# Patient Record
Sex: Male | Born: 1970 | Race: White | Hispanic: No | Marital: Married | Smoking: Never smoker
Health system: Southern US, Community
[De-identification: ages and names within clinical notes are randomized; demographics above are authoritative.]

## PROBLEM LIST (undated history)

## (undated) DIAGNOSIS — F32A Depression, unspecified: Secondary | ICD-10-CM

## (undated) DIAGNOSIS — M1990 Unspecified osteoarthritis, unspecified site: Secondary | ICD-10-CM

## (undated) DIAGNOSIS — J329 Chronic sinusitis, unspecified: Secondary | ICD-10-CM

## (undated) DIAGNOSIS — F419 Anxiety disorder, unspecified: Secondary | ICD-10-CM

## (undated) DIAGNOSIS — T7840XA Allergy, unspecified, initial encounter: Secondary | ICD-10-CM

## (undated) DIAGNOSIS — K219 Gastro-esophageal reflux disease without esophagitis: Secondary | ICD-10-CM

## (undated) HISTORY — DX: Depression, unspecified: F32.A

## (undated) HISTORY — DX: Unspecified osteoarthritis, unspecified site: M19.90

## (undated) HISTORY — PX: FRONTAL SINUSOTOMY: SUR1296

## (undated) HISTORY — DX: Anxiety disorder, unspecified: F41.9

## (undated) HISTORY — DX: Allergy, unspecified, initial encounter: T78.40XA

## (undated) HISTORY — DX: Gastro-esophageal reflux disease without esophagitis: K21.9

## (undated) HISTORY — PX: OTHER SURGICAL HISTORY: SHX169

## (undated) HISTORY — PX: CHOLECYSTECTOMY: SHX55

---

## 2008-08-04 DIAGNOSIS — Z8719 Personal history of other diseases of the digestive system: Secondary | ICD-10-CM | POA: Insufficient documentation

## 2010-12-14 ENCOUNTER — Emergency Department (INDEPENDENT_AMBULATORY_CARE_PROVIDER_SITE_OTHER): Payer: BC Managed Care – PPO

## 2010-12-14 ENCOUNTER — Emergency Department (HOSPITAL_BASED_OUTPATIENT_CLINIC_OR_DEPARTMENT_OTHER)
Admission: EM | Admit: 2010-12-14 | Discharge: 2010-12-14 | Disposition: A | Discharge status: Home / Self Care | Payer: BC Managed Care – PPO | Attending: Emergency Medicine | Admitting: Emergency Medicine

## 2010-12-14 DIAGNOSIS — X500XXA Overexertion from strenuous movement or load, initial encounter: Secondary | ICD-10-CM

## 2010-12-14 DIAGNOSIS — K219 Gastro-esophageal reflux disease without esophagitis: Secondary | ICD-10-CM | POA: Insufficient documentation

## 2010-12-14 DIAGNOSIS — S93609A Unspecified sprain of unspecified foot, initial encounter: Secondary | ICD-10-CM | POA: Insufficient documentation

## 2010-12-14 DIAGNOSIS — Y9375 Activity, martial arts: Secondary | ICD-10-CM | POA: Insufficient documentation

## 2010-12-14 DIAGNOSIS — M79609 Pain in unspecified limb: Secondary | ICD-10-CM

## 2011-03-16 ENCOUNTER — Encounter: Payer: Self-pay | Admitting: Emergency Medicine

## 2011-03-16 ENCOUNTER — Emergency Department (INDEPENDENT_AMBULATORY_CARE_PROVIDER_SITE_OTHER): Payer: BC Managed Care – PPO

## 2011-03-16 ENCOUNTER — Emergency Department (HOSPITAL_BASED_OUTPATIENT_CLINIC_OR_DEPARTMENT_OTHER)
Admission: EM | Admit: 2011-03-16 | Discharge: 2011-03-16 | Disposition: A | Discharge status: Home / Self Care | Payer: BC Managed Care – PPO | Attending: Emergency Medicine | Admitting: Emergency Medicine

## 2011-03-16 DIAGNOSIS — R059 Cough, unspecified: Secondary | ICD-10-CM

## 2011-03-16 DIAGNOSIS — R0989 Other specified symptoms and signs involving the circulatory and respiratory systems: Secondary | ICD-10-CM

## 2011-03-16 DIAGNOSIS — R0602 Shortness of breath: Secondary | ICD-10-CM

## 2011-03-16 DIAGNOSIS — J189 Pneumonia, unspecified organism: Secondary | ICD-10-CM

## 2011-03-16 DIAGNOSIS — R05 Cough: Secondary | ICD-10-CM

## 2011-03-16 LAB — CBC
HCT: 40.7 % (ref 39.0–52.0)
MCHC: 34.2 g/dL (ref 30.0–36.0)
MCV: 80 fL (ref 78.0–100.0)
RDW: 13.5 % (ref 11.5–15.5)

## 2011-03-16 LAB — DIFFERENTIAL
Basophils Absolute: 0 10*3/uL (ref 0.0–0.1)
Basophils Relative: 0 % (ref 0–1)
Eosinophils Relative: 2 % (ref 0–5)
Monocytes Absolute: 2.1 10*3/uL — ABNORMAL HIGH (ref 0.1–1.0)

## 2011-03-16 MED ORDER — LEVOFLOXACIN 500 MG PO TABS: 500.0000 mg | ORAL_TABLET | Freq: Every day | ORAL | Status: AC

## 2011-03-16 MED ORDER — ALBUTEROL SULFATE (5 MG/ML) 0.5% IN NEBU
2.5000 mg | INHALATION_SOLUTION | Freq: Once | RESPIRATORY_TRACT | Status: AC
  Administered 2011-03-16: 2.5 mg via RESPIRATORY_TRACT
  Filled 2011-03-16: qty 0.5

## 2011-03-16 MED ORDER — LEVOFLOXACIN 500 MG PO TABS
500.0000 mg | ORAL_TABLET | Freq: Every day | ORAL | Status: DC
  Filled 2011-03-16: qty 1

## 2011-03-16 MED ORDER — IPRATROPIUM BROMIDE 0.02 % IN SOLN
0.5000 mg | Freq: Once | RESPIRATORY_TRACT | Status: AC
  Administered 2011-03-16: 0.5 mg via RESPIRATORY_TRACT
  Filled 2011-03-16: qty 2.5

## 2011-03-16 MED ORDER — PREDNISONE 10 MG PO TABS: 50.0000 mg | ORAL_TABLET | Freq: Every day | ORAL | Status: AC

## 2011-03-16 MED ORDER — PREDNISONE 20 MG PO TABS
60.0000 mg | ORAL_TABLET | Freq: Once | ORAL | Status: AC
  Administered 2011-03-16: 60 mg via ORAL
  Filled 2011-03-16: qty 3

## 2011-03-16 NOTE — ED Notes (Signed)
Pt ambulatory. Voiced understanding of prescriptions.

## 2011-03-16 NOTE — ED Notes (Signed)
Pt reports increased SOB chest congestion and tightness with cough HX of CF

## 2011-03-16 NOTE — ED Provider Notes (Signed)
History     CSN: [id number] Arrival date & time: 03/16/2011  2:36 PM  Chief Complaint  Patient presents with  . Cough    Cough with increased SOB Hx of CF with Treatment at [facility]   Patient is a 40 y.o. male presenting with cough. The history is provided by the patient.  Cough The current episode started 2 days ago. The problem occurs constantly. The problem has been gradually worsening. 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 There has been no fever. Associated symptoms include chills, myalgias, shortness of breath and wheezing. Pertinent negatives include no chest pain, no sweats, no ear pain, no rhinorrhea and no sore throat. He has tried nothing for the symptoms. He is not a smoker.  Symptoms are moderate to severe. He has associated tightness in his chest. He called his PCP who told him to come in to be checked whether he has an infection or a flare up of his Cystic Fibrosis.  Past Medical History  Diagnosis Date  . Cystic fibrosis     Past Surgical History  Procedure Date  . Frontal sinusotomy   . Cholecystectomy     Family History  Problem Relation Age of Onset  . Cancer Mother   . Hyperlipidemia Father   . Hypertension Father     History  Substance Use Topics  . Smoking status: Never Smoker   . Smokeless tobacco: Not on file  . Alcohol Use: No      Review of Systems  Constitutional: Positive for chills.  HENT: Negative for ear pain, sore throat and rhinorrhea.   Respiratory: Positive for cough, shortness of breath and wheezing.   Cardiovascular: Negative for chest pain.  Musculoskeletal: Positive for myalgias.  All other systems reviewed and are negative.    Physical Exam  BP 129/79  Pulse 102  Temp(Src) 99.6 F (37.6 C) (Oral)  Resp 22  SpO2 99%  Physical Exam  Nursing note and vitals reviewed. Constitutional: He is oriented to person, place, and time. He appears  well-developed and well-nourished. No distress.  HENT:  Head: Normocephalic and atraumatic.  Right Ear: External ear normal.  Left Ear: External ear normal.  Nose: Nose normal.  Mouth/Throat: Oropharynx is clear and moist.  Eyes: Conjunctivae and EOM are normal. Pupils are equal, round, and reactive to light. No scleral icterus.  Neck: No JVD present.  Cardiovascular: Normal rate, regular rhythm and normal heart sounds.   No murmur heard. Pulmonary/Chest: Effort normal.       Few rales left base. Scattered wheezes.  Abdominal: Soft. Bowel sounds are normal. He exhibits no mass. There is no tenderness.  Musculoskeletal: Normal range of motion. He exhibits no edema and no tenderness.  Lymphadenopathy:    He has no cervical adenopathy.  Neurological: He is alert and oriented to person, place, and time. No cranial nerve deficit. Coordination normal.  Skin: Skin is warm and dry. No rash noted.  Psychiatric: He has a normal mood and affect.    ED Course  Procedures  MDM CAP in a patient with Cystic Fibrosis. He feels better after Albuterol/Atrovent NMT. Labs and x-rays reviewed, images viewed by me.  Results for orders placed during the hospital encounter of 03/16/11  CBC      Component Value Range   WBC 14.5 (*) 4.0 - 10.5 (K/uL)   RBC 5.09  4.22 - 5.81 (MIL/uL)   Hemoglobin 13.9  13.0 -  17.0 (g/dL)   HCT 13.2  44.0 - 10.2 (%)   MCV 80.0  78.0 - 100.0 (fL)   MCH 27.3  26.0 - 34.0 (pg)   MCHC 34.2  30.0 - 36.0 (g/dL)   RDW 72.5  36.6 - 44.0 (%)   Platelets 293  150 - 400 (K/uL)  DIFFERENTIAL      Component Value Range   Neutrophils Relative 74  43 - 77 (%)   Neutro Abs 10.7 (*) 1.7 - 7.7 (K/uL)   Lymphocytes Relative 10 (*) 12 - 46 (%)   Lymphs Abs 1.4  0.7 - 4.0 (K/uL)   Monocytes Relative 14 (*) 3 - 12 (%)   Monocytes Absolute 2.1 (*) 0.1 - 1.0 (K/uL)   Eosinophils Relative 2  0 - 5 (%)   Eosinophils Absolute 0.3  0.0 - 0.7 (K/uL)   Basophils Relative 0  0 - 1 (%)    Basophils Absolute 0.0  0.0 - 0.1 (K/uL)   Dg Chest 2 View  03/16/2011  *RADIOLOGY REPORT*  Clinical Data: Cough, shortness of breath, chest congestion  CHEST - 2 VIEW  Comparison: None.  Findings: Prominent interstitial markings with mild nodularity in the lateral right mid lung, possibly reflecting mild pneumonia. No pleural effusion or pneumothorax.  Cardiomediastinal silhouette is within normal limits.  Visualized osseous structures are within normal limits.  Cholecystectomy clips.  IMPRESSION: Prominent interstitial markings with mild nodularity in the lateral right mid lung, possibly reflecting mild pneumonia.  Original Report Authenticated By: Charline Bills, M.D.          Dione Booze, MD 03/16/11 1622

## 2011-07-02 ENCOUNTER — Encounter (HOSPITAL_COMMUNITY): Payer: Self-pay | Admitting: *Deleted

## 2011-07-02 ENCOUNTER — Emergency Department (HOSPITAL_COMMUNITY)
Admission: EM | Admit: 2011-07-02 | Discharge: 2011-07-02 | Disposition: A | Discharge status: Home / Self Care | Payer: BC Managed Care – PPO | Attending: Emergency Medicine | Admitting: Emergency Medicine

## 2011-07-02 DIAGNOSIS — R109 Unspecified abdominal pain: Secondary | ICD-10-CM | POA: Insufficient documentation

## 2011-07-02 DIAGNOSIS — K859 Acute pancreatitis without necrosis or infection, unspecified: Secondary | ICD-10-CM

## 2011-07-02 DIAGNOSIS — M549 Dorsalgia, unspecified: Secondary | ICD-10-CM | POA: Insufficient documentation

## 2011-07-02 HISTORY — DX: Chronic sinusitis, unspecified: J32.9

## 2011-07-02 HISTORY — DX: Cystic fibrosis, unspecified: E84.9

## 2011-07-02 LAB — COMPREHENSIVE METABOLIC PANEL
AST: 19 U/L (ref 0–37)
Albumin: 4.5 g/dL (ref 3.5–5.2)
Alkaline Phosphatase: 79 U/L (ref 39–117)
Chloride: 103 mEq/L (ref 96–112)
Creatinine, Ser: 0.95 mg/dL (ref 0.50–1.35)
Potassium: 4.1 mEq/L (ref 3.5–5.1)
Total Bilirubin: 0.5 mg/dL (ref 0.3–1.2)

## 2011-07-02 LAB — URINALYSIS, ROUTINE W REFLEX MICROSCOPIC
Bilirubin Urine: NEGATIVE
Glucose, UA: NEGATIVE mg/dL
Hgb urine dipstick: NEGATIVE
Ketones, ur: NEGATIVE mg/dL
Leukocytes, UA: NEGATIVE
Nitrite: NEGATIVE
Protein, ur: NEGATIVE mg/dL
Specific Gravity, Urine: 1.013 (ref 1.005–1.030)
Urobilinogen, UA: 0.2 mg/dL (ref 0.0–1.0)
pH: 8 (ref 5.0–8.0)

## 2011-07-02 LAB — CBC
HCT: 42.4 % (ref 39.0–52.0)
Hemoglobin: 14.2 g/dL (ref 13.0–17.0)
MCH: 27.7 pg (ref 26.0–34.0)
MCHC: 33.5 g/dL (ref 30.0–36.0)
MCV: 82.7 fL (ref 78.0–100.0)
Platelets: 324 K/uL (ref 150–400)
RBC: 5.13 MIL/uL (ref 4.22–5.81)
RDW: 13.9 % (ref 11.5–15.5)
WBC: 9.1 K/uL (ref 4.0–10.5)

## 2011-07-02 LAB — COMPREHENSIVE METABOLIC PANEL WITH GFR
ALT: 15 U/L (ref 0–53)
BUN: 9 mg/dL (ref 6–23)
CO2: 27 meq/L (ref 19–32)
Calcium: 9.8 mg/dL (ref 8.4–10.5)
GFR calc Af Amer: >90 mL/min (ref >90)
GFR calc non Af Amer: >90 mL/min (ref >90)
Glucose, Bld: 88 mg/dL (ref 70–99)
Sodium: 141 meq/L (ref 135–145)
Total Protein: 7.7 g/dL (ref 6.0–8.3)

## 2011-07-02 LAB — DIFFERENTIAL
Basophils Absolute: 0 10*3/uL (ref 0.0–0.1)
Basophils Relative: 0 % (ref 0–1)
Eosinophils Absolute: 0.2 K/uL (ref 0.0–0.7)
Eosinophils Relative: 2 % (ref 0–5)
Lymphocytes Relative: 14 % (ref 12–46)
Lymphs Abs: 1.3 K/uL (ref 0.7–4.0)
Monocytes Absolute: 0.8 K/uL (ref 0.1–1.0)
Monocytes Relative: 9 % (ref 3–12)
Neutro Abs: 6.8 10*3/uL (ref 1.7–7.7)
Neutrophils Relative %: 75 % (ref 43–77)

## 2011-07-02 LAB — LIPASE, BLOOD: Lipase: 71 U/L — ABNORMAL HIGH (ref 11–59)

## 2011-07-02 MED ORDER — SODIUM CHLORIDE 0.9 % IV SOLN
Freq: Once | INTRAVENOUS | Status: AC
  Administered 2011-07-02: 14:00:00 via INTRAVENOUS

## 2011-07-02 MED ORDER — HYDROMORPHONE HCL PF 1 MG/ML IJ SOLN
1.0000 mg | Freq: Once | INTRAMUSCULAR | Status: AC
  Administered 2011-07-02: 1 mg via INTRAVENOUS
  Filled 2011-07-02: qty 1

## 2011-07-02 MED ORDER — OXYCODONE-ACETAMINOPHEN 5-325 MG PO TABS: ORAL_TABLET | ORAL | Status: DC

## 2011-07-02 NOTE — Discharge Instructions (Signed)
Acute Pancreatitis The pancreas is a large gland located behind your stomach. It produces (secretes) enzymes. These enzymes help digest food. It also releases the hormones glucagon and insulin. These hormones help regulate blood sugar. When the pancreas becomes inflamed, the disease is called pancreatitis. Inflammation of the pancreas occurs when enzymes from the pancreas begin attacking and digesting the pancreas. CAUSES  Most cases ofsudden onset (acute) pancreatitis are caused by:  Alcohol abuse.   Gallstones.  Other less common causes are:  Some medications.   Exposure to certain chemicals   Infection.   Damage caused by an accident (trauma).   Surgery of the belly (abdomen).  SYMPTOMS  Acute pancreatitis usually begins with pain in the upper abdomen and may radiate to the back. This pain may last a couple days. The constant pain varies from mild to severe. The acute form of this disease may vary from mild, nonspecific abdominal pain to profound shock with coma. About 1 in 5 cases are severe. These patients become dehydrated and develop low blood pressure. In severe cases, bleeding into the pancreas can lead to shock and death. The lungs, heart, and kidneys may fail. DIAGNOSIS  Your caregiver will form a clinical opinion after giving you an exam. Laboratory work is used to confirm this diagnosis. Often,a digestive enzyme from the pancreas (serum amylase) and other enzymes are elevated. Sugars and fats (lipids) in the blood may be elevated. There may also be changes in the following levels: calcium, magnesium, potassium, chloride and bicarbonate (chemicals in the blood). X-rays, a CT scan, or ultrasound of your abdomen may be necessary to search for other causes of your abdominal pain. TREATMENT  Most pancreatitis requires treatment of symptoms. Most acute attacks last a couple of days. Your caregiver can discuss the treatment options with you.  If complications occur, hospitalization  may be necessary for pain control and intravenous (IV) fluid replacement.   Sometimes, a tube may be put into the stomach to control vomiting.   Food may not be allowed for 3 to 4 days. This gives the pancreas time to rest. Giving the pancreas a rest means there is no stimulation that would produce more enzymes and cause more damage.   Medicines (antibiotics) that kill germs may be given if infection is the cause.   Sometimes, surgery may be required.   Following an acute attack, your caregiver will determine the cause, if possible, and offer suggestions to prevent recurrences.  HOME CARE INSTRUCTIONS   Eat smaller, more frequent meals. This reduces the amount of digestive juices the pancreas produces.   Decrease the amount of fat in your diet. This may help reduce loose, diarrheal stools.   Drink enough water and fluids to keep your urine clear or pale yellow. This is to avoid dehydration which can cause increased pain.   Talk to your caregiver about pain relievers or other medicines that may help.   Avoid anything that may have triggered your pancreatitis (for example, alcohol).   Follow the diet advised by your caregiver. Do not advance the diet too soon.   Take medicines as prescribed.   Get plenty of rest.   Check your blood sugar at home as directed by your caregiver.   If your caregiver has given you a follow-up appointment, it is very important to keep that appointment. Not keeping the appointment could result in a lasting (chronic) or permanent injury, pain, and disability. If there is any problem keeping the appointment, you must call to reschedule.    SEEK MEDICAL CARE IF:   You are not recovering in the time described by your caregiver.   You have persistent pain, weakness, or feel sick to your stomach (nauseous).   You have recovered and then have another bout of pain.  SEEK IMMEDIATE MEDICAL CARE IF:   You are unable to eat or keep fluids down.   Your pain  increases a lot or changes.   You have an oral temperature above 102 F (38.9 C), not controlled by medicine.   Your skin or the white part of your eyes look yellow (jaundice).   You develop vomiting.   You feel dizzy or faint.   Your blood sugar is high (over 300).  MAKE SURE YOU:   Understand these instructions.   Will watch your condition.   Will get help right away if you are not doing well or get worse.  Document Released: 07/21/2005 Document Revised: 04/02/2011 Document Reviewed: 03/03/2008 Exit[employer] Patient Information 2012 [employer]. 

## 2011-07-02 NOTE — ED Notes (Signed)
Pt ambulatory to the BR with steady gait

## 2011-07-02 NOTE — ED Notes (Addendum)
Patient aware of need for urine specimen. Patient states that he doesn't need to go at the time supplies labeled at bedside.

## 2011-07-02 NOTE — ED Notes (Signed)
Pt reports LUQ that radiates to his mid abdomen.  Pt denies n/v at this time. Pt reports pain woke him up at 0200 today.  Pt reports hx of pancreatitis.

## 2011-07-02 NOTE — ED Provider Notes (Signed)
History     CSN: [id number] Arrival date & time: 07/02/2011  9:06 AM   First MD Initiated Contact with Patient 07/02/11 [phone]      Chief Complaint  Patient presents with  . 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 He reports he had his first episode when he is about 40 years old and has had rare episodes however in the last 5 years they have become more frequent. He does not report any obvious inciting factors. He reports he did eat a heavy meal last night but he does not drink alcohol, has not had any fevers or URI symptoms. His primary care physician is with [facility], and his CSF physician is at [facility]. He reports that previously when he has episodes he often will have to be admitted for her for 5 days for IV fluids and pain medications. He reports that this episode is mild compared to others. He reports he only has pain in his mid abdominal area that radiates to his back, no nausea vomiting or diarrhea and no fevers. He reports no shortness of breath or coughing.  The history is provided by the patient.    Past Medical History  Diagnosis Date  . Cystic fibrosis   . Pancreatic cystic fibrosis   . Sinusitis     Past Surgical History  Procedure Date  . Frontal sinusotomy   . Cholecystectomy     Family History  Problem Relation Age of Onset  . Cancer Mother   . Hyperlipidemia Father   . Hypertension Father     History  Substance Use Topics  . Smoking status: Never Smoker   . Smokeless tobacco: Not on file  . Alcohol Use: No      Review of Systems  Constitutional: Negative for fever and chills.  HENT: Negative for congestion and rhinorrhea.   Respiratory: Negative for cough and shortness of breath.   Gastrointestinal: Positive for abdominal pain. Negative for nausea,  vomiting, diarrhea and constipation.  Musculoskeletal: Positive for back pain.  All other systems reviewed and are negative.    Allergies  Septra  Home Medications   Current Outpatient Rx  Name Route Sig Dispense Refill  . ERGOCALCIFEROL 50000 UNITS PO CAPS Oral Take 50,000 Units by mouth 2 (two) times a week. Monday,thursday    . FLUTICASONE PROPIONATE 50 MCG/ACT NA SUSP Nasal Place 2 sprays into the nose daily.     Marland Kitchen FLUTICASONE-SALMETEROL 500-50 MCG/DOSE IN AEPB Inhalation Inhale 1 puff into the lungs every 12 (twelve) hours.      Marland Kitchen HYDROCODONE-ACETAMINOPHEN 5-500 MG PO CAPS Oral Take 1 capsule by mouth 2 (two) times daily as needed. For pain      . IBUPROFEN 200 MG PO TABS Oral Take 400 mg by mouth once as needed. For pain     . OMEPRAZOLE 10 MG PO CPDR Oral Take 10 mg by mouth daily.     . ALBUTEROL SULFATE HFA 108 (90 BASE) MCG/ACT IN AERS Inhalation Inhale 2 puffs into the lungs 2 (two) times daily as needed. For shortness of breath     . CETIRIZINE HCL 10 MG PO TABS Oral Take 10 mg by mouth daily.        BP 138/84  Pulse 78  Temp(Src) 98.4 F (36.9 C) (Oral)  Resp 16  Wt 154 lb (69.854 kg)  SpO2 100%  Physical Exam  Nursing note and vitals reviewed. Constitutional: He is oriented to person, place, and time. He appears well-developed and well-nourished.  HENT:  Head: Normocephalic and atraumatic.  Eyes: Conjunctivae are normal. Pupils are equal, round, and reactive to light. No scleral icterus.  Neck: Normal range of motion. Neck supple.  Cardiovascular: Normal rate.   Pulmonary/Chest: Effort normal and breath sounds normal. No respiratory distress.  Abdominal: Soft. There is tenderness. There is no rebound and no guarding.  Musculoskeletal: Normal range of motion.  Neurological: He is alert and oriented to person, place, and time.  Skin: Skin is warm and dry.    ED Course  Procedures (including critical care time)   Labs Reviewed  CBC  DIFFERENTIAL    COMPREHENSIVE METABOLIC PANEL  LIPASE, BLOOD  URINALYSIS, ROUTINE W REFLEX MICROSCOPIC   No results found.   No diagnosis found.    MDM  IVF's, IV analgesics, will get lipase to compare to prior episodes.  Pt is not vomiting, doesn't appear dehydrated.  Pt does not want to be admitted at [facility] even though PCP is with [facility].   No rebound tenderness, no surgical abd on exam.       2:06 PM Due to lipase only at 71, Ranson's criteria suggests mortality is not high, if pt is feeling improved, can eat and drink, likely can go home.    3:41 PM Pt feels improved after IV dilaudid 1 mg.  If he can eat and drink, I think pt can go home.  He normally takes a vicodin for pain when it flares, reports it didn't help this AM.  Will give a percocet prescription   Gavin Pound. Lasaro Primm, MD 07/02/11 1542

## 2011-07-02 NOTE — ED Notes (Signed)
Pt states "I woke up around 0200 with abd pain, I have chronic pancreatitis d/t mild cystic fibrosis, I called my MD @ [facility] & he told me to come in, I need to look for a pulmonary specialist here"

## 2012-01-28 ENCOUNTER — Other Ambulatory Visit: Payer: Self-pay | Admitting: Gastroenterology

## 2012-01-28 DIAGNOSIS — K861 Other chronic pancreatitis: Secondary | ICD-10-CM

## 2012-01-28 DIAGNOSIS — R1013 Epigastric pain: Secondary | ICD-10-CM

## 2012-02-03 ENCOUNTER — Other Ambulatory Visit (HOSPITAL_COMMUNITY): Payer: Self-pay | Admitting: Gastroenterology

## 2012-02-03 ENCOUNTER — Other Ambulatory Visit: Payer: Self-pay | Admitting: Gastroenterology

## 2012-02-03 ENCOUNTER — Ambulatory Visit (HOSPITAL_COMMUNITY)
Admission: RE | Admit: 2012-02-03 | Discharge: 2012-02-03 | Disposition: A | Discharge status: Home / Self Care | Payer: 59 | Source: Ambulatory Visit | Attending: Gastroenterology | Admitting: Gastroenterology

## 2012-02-03 DIAGNOSIS — K861 Other chronic pancreatitis: Secondary | ICD-10-CM

## 2012-02-03 DIAGNOSIS — R1013 Epigastric pain: Secondary | ICD-10-CM | POA: Insufficient documentation

## 2012-02-03 DIAGNOSIS — R911 Solitary pulmonary nodule: Secondary | ICD-10-CM | POA: Insufficient documentation

## 2012-02-03 DIAGNOSIS — K7689 Other specified diseases of liver: Secondary | ICD-10-CM | POA: Insufficient documentation

## 2012-02-03 MED ORDER — GADOBENATE DIMEGLUMINE 529 MG/ML IV SOLN
14.0000 mL | Freq: Once | INTRAVENOUS | Status: AC
  Administered 2012-02-03: 14 mL via INTRAVENOUS

## 2012-06-12 ENCOUNTER — Encounter (HOSPITAL_COMMUNITY): Payer: Self-pay | Admitting: Emergency Medicine

## 2012-06-12 ENCOUNTER — Inpatient Hospital Stay (HOSPITAL_COMMUNITY)
Admission: EM | Admit: 2012-06-12 | Discharge: 2012-06-16 | DRG: 439 | Disposition: A | Discharge status: Home / Self Care | Payer: 59 | Attending: Internal Medicine | Admitting: Internal Medicine

## 2012-06-12 DIAGNOSIS — K859 Acute pancreatitis without necrosis or infection, unspecified: Principal | ICD-10-CM

## 2012-06-12 DIAGNOSIS — J329 Chronic sinusitis, unspecified: Secondary | ICD-10-CM

## 2012-06-12 DIAGNOSIS — J309 Allergic rhinitis, unspecified: Secondary | ICD-10-CM

## 2012-06-12 DIAGNOSIS — K861 Other chronic pancreatitis: Secondary | ICD-10-CM | POA: Diagnosis present

## 2012-06-12 LAB — CBC WITH DIFFERENTIAL/PLATELET
Eosinophils Absolute: 0.3 10*3/uL (ref 0.0–0.7)
Eosinophils Relative: 2 % (ref 0–5)
Hemoglobin: 13.9 g/dL (ref 13.0–17.0)
Lymphocytes Relative: 7 % — ABNORMAL LOW (ref 12–46)
Lymphs Abs: 1.2 10*3/uL (ref 0.7–4.0)
MCH: 27.5 pg (ref 26.0–34.0)
MCV: 79.8 fL (ref 78.0–100.0)
Monocytes Relative: 7 % (ref 3–12)
RBC: 5.05 MIL/uL (ref 4.22–5.81)
WBC: 16.1 10*3/uL — ABNORMAL HIGH (ref 4.0–10.5)

## 2012-06-12 LAB — COMPREHENSIVE METABOLIC PANEL
ALT: 20 U/L (ref 0–53)
Alkaline Phosphatase: 68 U/L (ref 39–117)
BUN: 10 mg/dL (ref 6–23)
CO2: 27 mEq/L (ref 19–32)
Calcium: 9.3 mg/dL (ref 8.4–10.5)
GFR calc Af Amer: >90 mL/min (ref >90)
GFR calc non Af Amer: >90 mL/min (ref >90)
Glucose, Bld: 94 mg/dL (ref 70–99)
Potassium: 4.1 mEq/L (ref 3.5–5.1)
Total Protein: 7.4 g/dL (ref 6.0–8.3)

## 2012-06-12 LAB — MRSA PCR SCREENING: MRSA by PCR: NEGATIVE

## 2012-06-12 LAB — LIPASE, BLOOD: Lipase: 1834 U/L — ABNORMAL HIGH (ref 11–59)

## 2012-06-12 MED ORDER — HYDROMORPHONE HCL PF 1 MG/ML IJ SOLN: 0.5000 mg | INTRAMUSCULAR | Status: DC | PRN

## 2012-06-12 MED ORDER — HYDROMORPHONE HCL PF 1 MG/ML IJ SOLN
1.0000 mg | INTRAMUSCULAR | Status: AC | PRN
  Administered 2012-06-13 (×2): 1 mg via INTRAVENOUS
  Filled 2012-06-12 (×2): qty 1

## 2012-06-12 MED ORDER — FLUTICASONE PROPIONATE 50 MCG/ACT NA SUSP
2.0000 | Freq: Every day | NASAL | Status: DC
  Administered 2012-06-12 – 2012-06-15 (×4): 2 via NASAL
  Filled 2012-06-12: qty 16

## 2012-06-12 MED ORDER — HYDROMORPHONE HCL PF 1 MG/ML IJ SOLN
1.0000 mg | Freq: Once | INTRAMUSCULAR | Status: AC
  Administered 2012-06-12: 1 mg via INTRAVENOUS
  Filled 2012-06-12: qty 1

## 2012-06-12 MED ORDER — ENOXAPARIN SODIUM 40 MG/0.4ML ~~LOC~~ SOLN
40.0000 mg | SUBCUTANEOUS | Status: DC
  Filled 2012-06-12 (×5): qty 0.4

## 2012-06-12 MED ORDER — MOMETASONE FURO-FORMOTEROL FUM 200-5 MCG/ACT IN AERO
2.0000 | INHALATION_SPRAY | Freq: Two times a day (BID) | RESPIRATORY_TRACT | Status: DC
  Administered 2012-06-13 – 2012-06-16 (×7): 2 via RESPIRATORY_TRACT
  Filled 2012-06-12: qty 8.8

## 2012-06-12 MED ORDER — FLUCONAZOLE 100 MG PO TABS
100.0000 mg | ORAL_TABLET | Freq: Every day | ORAL | Status: DC
  Administered 2012-06-12 – 2012-06-15 (×4): 100 mg via ORAL
  Filled 2012-06-12 (×5): qty 1

## 2012-06-12 MED ORDER — ONDANSETRON HCL 4 MG/2ML IJ SOLN
4.0000 mg | Freq: Once | INTRAMUSCULAR | Status: AC
  Administered 2012-06-12: 4 mg via INTRAVENOUS
  Filled 2012-06-12: qty 2

## 2012-06-12 MED ORDER — ONDANSETRON HCL 4 MG/2ML IJ SOLN: 4.0000 mg | Freq: Four times a day (QID) | INTRAMUSCULAR | Status: DC | PRN

## 2012-06-12 MED ORDER — ALUM & MAG HYDROXIDE-SIMETH 200-200-20 MG/5ML PO SUSP: 30.0000 mL | Freq: Four times a day (QID) | ORAL | Status: DC | PRN

## 2012-06-12 MED ORDER — ACETAMINOPHEN 325 MG PO TABS: 650.0000 mg | ORAL_TABLET | Freq: Four times a day (QID) | ORAL | Status: DC | PRN

## 2012-06-12 MED ORDER — SODIUM CHLORIDE 0.9 % IV SOLN
INTRAVENOUS | Status: AC
  Administered 2012-06-12: 21:00:00 via INTRAVENOUS

## 2012-06-12 MED ORDER — HYDROMORPHONE HCL PF 2 MG/ML IJ SOLN
2.0000 mg | Freq: Once | INTRAMUSCULAR | Status: AC
  Administered 2012-06-12: 2 mg via INTRAVENOUS
  Filled 2012-06-12: qty 1

## 2012-06-12 MED ORDER — ONDANSETRON HCL 4 MG PO TABS: 4.0000 mg | ORAL_TABLET | Freq: Four times a day (QID) | ORAL | Status: DC | PRN

## 2012-06-12 MED ORDER — SODIUM CHLORIDE 0.9 % IV SOLN
INTRAVENOUS | Status: DC
  Administered 2012-06-12 – 2012-06-14 (×4): via INTRAVENOUS
  Administered 2012-06-15: 75 mL via INTRAVENOUS

## 2012-06-12 MED ORDER — PANTOPRAZOLE SODIUM 40 MG IV SOLR
40.0000 mg | Freq: Two times a day (BID) | INTRAVENOUS | Status: DC
  Administered 2012-06-12 – 2012-06-14 (×5): 40 mg via INTRAVENOUS
  Filled 2012-06-12 (×7): qty 40

## 2012-06-12 MED ORDER — ACETAMINOPHEN 650 MG RE SUPP: 650.0000 mg | Freq: Four times a day (QID) | RECTAL | Status: DC | PRN

## 2012-06-12 MED ORDER — DICLOXACILLIN SODIUM 500 MG PO CAPS
500.0000 mg | ORAL_CAPSULE | Freq: Four times a day (QID) | ORAL | Status: DC
  Administered 2012-06-13 – 2012-06-15 (×12): 500 mg via ORAL
  Filled 2012-06-12 (×2): qty 1

## 2012-06-12 MED ORDER — SODIUM CHLORIDE 0.9 % IV BOLUS (SEPSIS)
1000.0000 mL | Freq: Once | INTRAVENOUS | Status: AC
  Administered 2012-06-12: 1000 mL via INTRAVENOUS

## 2012-06-12 MED ORDER — LORATADINE 10 MG PO TABS
10.0000 mg | ORAL_TABLET | Freq: Every day | ORAL | Status: DC
  Administered 2012-06-13 – 2012-06-15 (×3): 10 mg via ORAL
  Filled 2012-06-12 (×5): qty 1

## 2012-06-12 MED ORDER — ONDANSETRON HCL 4 MG/2ML IJ SOLN: 4.0000 mg | Freq: Three times a day (TID) | INTRAMUSCULAR | Status: AC | PRN

## 2012-06-12 MED ORDER — OXYCODONE HCL 5 MG PO TABS
5.0000 mg | ORAL_TABLET | ORAL | Status: DC | PRN
  Administered 2012-06-15 (×2): 5 mg via ORAL
  Filled 2012-06-12 (×3): qty 1

## 2012-06-12 MED ORDER — ZOLPIDEM TARTRATE 5 MG PO TABS
5.0000 mg | ORAL_TABLET | Freq: Every evening | ORAL | Status: DC | PRN
  Administered 2012-06-13 – 2012-06-15 (×4): 5 mg via ORAL
  Filled 2012-06-12 (×4): qty 1

## 2012-06-12 MED ORDER — ALBUTEROL SULFATE HFA 108 (90 BASE) MCG/ACT IN AERS: 2.0000 | INHALATION_SPRAY | Freq: Four times a day (QID) | RESPIRATORY_TRACT | Status: DC | PRN

## 2012-06-12 NOTE — ED Notes (Signed)
Called to give report, receiving rn is currently in another pts room.  Will return phone call.

## 2012-06-12 NOTE — ED Notes (Signed)
Pt presents w/ upper abdominal pain, one episode of nausea w/o emesis. Pain started this morning. Pt w/ hx of pancreatitis, acute and chronic, feels similar to previous attacks. Pain meds at home are not working, Vicodin @1100 , Percocet @1200  and another Vicodin at 1500 w/o relief. Wanted to come in before pain worsened.

## 2012-06-12 NOTE — H&P (Addendum)
[facility] History and Physical  Kailo Kosik ZOX:[id number] DOB: [birth year] DOA: 06/12/2012  Referring physician: EDP PCP: Theda Belfast, MD  Specialists:   Chief Complaint:  ABD Pain  HPI: Bryan Alvarado is a 41 y.o. male with a history of Cystic Fibrosis who presents with severe 10/10 epigastric ABD Pain and nausea since the AM.  He denies having vomiting or diarrhea.  He also denies having any fevers or chills.  In the ED, he was found to have a Lipase level of 1834, and he was referred for medical admission.  He reports having bouts of pancreatitis since the age of 52.     Review of Systems: The patient denies anorexia, fever, weight loss,, vision loss, decreased hearing, hoarseness, chest pain, syncope, dyspnea on exertion, peripheral edema, balance deficits, hemoptysis, abdominal pain, melena, hematochezia, severe indigestion/heartburn, hematuria, incontinence, genital sores, muscle weakness, suspicious skin lesions, transient blindness, difficulty walking, depression, unusual weight change, abnormal bleeding, enlarged lymph nodes, angioedema, and breast masses.    Past Medical History  Diagnosis Date  . Cystic fibrosis   . Pancreatic cystic fibrosis   . Sinusitis    Past Surgical History  Procedure Date  . Frontal sinusotomy   . Cholecystectomy     Medications:  HOME MEDS: Prior to Admission medications   Medication Sig Start Date End Date Taking? Authorizing Provider  albuterol (PROAIR HFA) 108 (90 BASE) MCG/ACT inhaler Inhale 2 puffs into the lungs 2 (two) times daily as needed. For shortness of breath    Yes Historical Provider, MD  cetirizine (ZYRTEC) 10 MG tablet Take 10 mg by mouth daily.     Yes Historical Provider, MD  fluconazole (DIFLUCAN) 100 MG tablet Take 100 mg by mouth daily.   Yes Historical Provider, MD  fluticasone (FLONASE) 50 MCG/ACT nasal spray Place 2 sprays into the nose daily.    Yes Historical Provider, MD  Fluticasone-Salmeterol  (ADVAIR DISKUS) 500-50 MCG/DOSE AEPB Inhale 1 puff into the lungs every 12 (twelve) hours.     Yes Historical Provider, MD  hydrocodone-acetaminophen (LORCET-HD) 5-500 MG per capsule Take 1 capsule by mouth 2 (two) times daily as needed. For pain     Yes Historical Provider, MD  ibuprofen (ADVIL,MOTRIN) 200 MG tablet Take 400 mg by mouth once as needed. For pain    Yes Historical Provider, MD  lactobacillus acidophilus (BACID) TABS Take 2 tablets by mouth 2 (two) times daily.   Yes Historical Provider, MD  omeprazole (PRILOSEC) 10 MG capsule Take 10 mg by mouth daily.    Yes Historical Provider, MD  oxyCODONE-acetaminophen (PERCOCET/ROXICET) 5-325 MG per tablet Take 1 tablet by mouth every 6 (six) hours as needed. 1-2 tablets po q 4-6 hours as needed for moderate to severe pain 07/02/11  Yes Gavin Pound. Ghim, MD     Allergies  Allergen Reactions  . Septra (Bactrim) Hives    Social History:  reports that he has never smoked. He has never used smokeless tobacco. He reports that he does not drink alcohol or use illicit drugs.   Family History  Problem Relation Age of Onset  . Cancer Mother   . Hyperlipidemia Father   . Hypertension Father      Physical Exam:  GEN:  Pleasant 41 year old thin well developed Caucasian Male examined  and in no acute distress; cooperative with exam Filed Vitals:   06/12/12 1618  BP: 145/86  Pulse: 73  Temp: 98.8 F (37.1 C)  TempSrc: Oral  Resp: 18  SpO2: 100%  Blood pressure 145/86, pulse 73, temperature 98.8 F (37.1 C), temperature source Oral, resp. rate 18, SpO2 100.00%. PSYCH: He is alert and oriented x4; does not appear anxious does not appear depressed; affect is normal HEENT: Normocephalic and Atraumatic, Mucous membranes pink; PERRLA; EOM intact; Fundi:  Benign;  No scleral icterus, Nares: Patent, Oropharynx: Clear, Fair Dentition, Neck:  FROM, no cervical lymphadenopathy nor thyromegaly or carotid bruit; no JVD; Breasts:: Not  examined CHEST WALL: No tenderness CHEST: Normal respiration, clear to auscultation bilaterally HEART: Regular rate and rhythm; no murmurs rubs or gallops BACK: No kyphosis or scoliosis; no CVA tenderness ABDOMEN: Positive Bowel Sounds, Scaphoid, soft non-tender; no masses, no organomegaly.   Rectal Exam: Not done EXTREMITIES: No bone or joint deformity; age-appropriate arthropathy of the hands and knees; no cyanosis, clubbing or edema; no ulcerations. Genitalia: not examined PULSES: 2+ and symmetric SKIN: Normal hydration no rash or ulceration CNS: Cranial nerves 2-12 grossly intact no focal neurologic deficit    Labs on Admission:  Basic Metabolic Panel:  Lab 06/12/12 1610  NA 137  K 4.1  CL 100  CO2 27  GLUCOSE 94  BUN 10  CREATININE 0.85  CALCIUM 9.3  MG --  PHOS --   Liver Function Tests:  Lab 06/12/12 1902  AST 25  ALT 20  ALKPHOS 68  BILITOT 0.3  PROT 7.4  ALBUMIN 4.2    Lab 06/12/12 1902  LIPASE 1834*  AMYLASE --   No results found for this basename: AMMONIA:5 in the last 168 hours CBC:  Lab 06/12/12 1902  WBC 16.1*  NEUTROABS 13.4*  HGB 13.9  HCT 40.3  MCV 79.8  PLT 344   Cardiac Enzymes: No results found for this basename: CKTOTAL:5,CKMB:5,CKMBINDEX:5,TROPONINI:5 in the last 168 hours  BNP (last 3 results) No results found for this basename: PROBNP:3 in the last 8760 hours CBG: No results found for this basename: GLUCAP:5 in the last 168 hours  Radiological Exams on Admission: No results found.    Assessment: Principal Problem:  *Acute pancreatitis Active Problems:  Cystic fibrosis  Chronic sinusitis  Allergic rhinitis    Plan: Admit to MED/Surg Bed Pain Control Anti-emetics PRN IV Protonix Clear liquids Reconcile Home Medications DVT prophylaxis with Lovenox     Code Status:  FULL CODE Family Communication: Wife At Bedside Disposition Plan:  Return to Home   Time spent: 92 Minutes  Ron Parker [facility] Pager [phone]  If 7PM-7AM, please contact night-coverage [email or web address] Password [email or web address] 06/12/2012, 9:25 PM

## 2012-06-12 NOTE — ED Notes (Signed)
Called to give report x2, receiving nurse will return phone.

## 2012-06-12 NOTE — ED Provider Notes (Signed)
Complains of epigastric pain accompanied by nausea typical pancreatitis he's had in the past onset gradually last night. No vomiting feels improved after treatment in emergency department patient awake alert Glasgow Coma Score 15 nontoxic-appearing. Plan IV fluids bowel rest analgesics symptomatic treatment  [name], MD 06/12/12 1610

## 2012-06-12 NOTE — ED Notes (Signed)
MD at bedside. 

## 2012-06-12 NOTE — ED Provider Notes (Signed)
History  This chart was scribed for non-physician practitioner working with [name], MD by Bennett Scrape, ED Scribe. This patient was seen in room WA01/WA01 and the patient's care was started at 6:29PM.  CSN: [id number]  Arrival date & time 06/12/12  1609  First MD Initiated Contact with Patient 05/03/12 1729     Chief Complaint  Patient presents with  . Abdominal Pain    Patient is a 41 y.o. male presenting with abdominal pain. The history is provided by the patient. No language interpreter was used.  Abdominal Pain The primary symptoms of the illness include abdominal pain and nausea. The primary symptoms of the illness do not include fever, shortness of breath, vomiting, diarrhea or dysuria. The current episode started 6 to 12 hours ago. The onset of the illness was gradual. The problem has been gradually worsening.  The abdominal pain began 6 to 12 hours ago. The pain came on gradually. The abdominal pain has been gradually worsening since its onset. The abdominal pain is located in the LUQ. The abdominal pain radiates to the RUQ. The severity of the abdominal pain is 6/10. The abdominal pain is relieved by nothing. The abdominal pain is exacerbated by eating.  The patient has not had a change in bowel habit. Symptoms associated with the illness do not include chills, hematuria, frequency or back pain. Significant associated medical issues do not include GERD, inflammatory bowel disease or diabetes.    Bryan Alvarado is a 41 y.o. male with a h/o pancreatitis attributed cystic fibrosis who presents to the Emergency Department complaining of 7 hours of gradual onset, gradually worsening, constant LUQ abdominal pain that radiates into the RUQ with associated nausea. He rates his pain a 6 or 7 out of 10 currently. He reports taking 2 Vicodin pills (one at 7 hours ago and one an hour ago) and 1 5-325 mg Percocet 6 hours ago with mild improvement in his pain. He denies taking any  antiemetics and denies having an prescription for an antiemetic at home. He states that he was put on an antibiotic for a staff infection in his throat that he started one week ago. He reports that his last hospitalization was in January 2013 and states that he takes one Vicodin before dinner daily. Last BM was yesterday and it was normal. He denies fevers, emesis, diarrhea, leg swelling, back pain and urinary symptoms as associated symptoms. He denies smoking and alcohol use.  GI is Dr. Elnoria Howard, also seen at GI at [facility] No PCP currently, old PCP stopped practicing general medicine  Past Medical History  Diagnosis Date  . Cystic fibrosis   . Pancreatic cystic fibrosis   . Sinusitis     Past Surgical History  Procedure Date  . Frontal sinusotomy   . Cholecystectomy     Family History  Problem Relation Age of Onset  . Cancer Mother   . Hyperlipidemia Father   . Hypertension Father     History  Substance Use Topics  . Smoking status: Never Smoker   . Smokeless tobacco: Never Used  . Alcohol Use: No      Review of Systems  Constitutional: Negative.  Negative for fever and chills.  HENT: Negative.   Respiratory: Negative.  Negative for shortness of breath.   Cardiovascular: Negative.  Negative for leg swelling.  Gastrointestinal: Positive for nausea and abdominal pain. Negative for vomiting and diarrhea.  Genitourinary: Negative for dysuria, frequency and hematuria.  Musculoskeletal: Negative.  Negative for back pain.  Skin: Negative.   Neurological: Negative.   Hematological: Negative.   Psychiatric/Behavioral: Negative.   All other systems reviewed and are negative.    Allergies  Septra  Home Medications   Current Outpatient Rx  Name  Route  Sig  Dispense  Refill  . ALBUTEROL SULFATE HFA 108 (90 BASE) MCG/ACT IN AERS   Inhalation   Inhale 2 puffs into the lungs 2 (two) times daily as needed. For shortness of breath          . CETIRIZINE HCL 10 MG PO  TABS   Oral   Take 10 mg by mouth daily.           Marland Kitchen FLUCONAZOLE 100 MG PO TABS   Oral   Take 100 mg by mouth daily.         Marland Kitchen FLUTICASONE PROPIONATE 50 MCG/ACT NA SUSP   Nasal   Place 2 sprays into the nose daily.          Marland Kitchen FLUTICASONE-SALMETEROL 500-50 MCG/DOSE IN AEPB   Inhalation   Inhale 1 puff into the lungs every 12 (twelve) hours.           Marland Kitchen HYDROCODONE-ACETAMINOPHEN 5-500 MG PO CAPS   Oral   Take 1 capsule by mouth 2 (two) times daily as needed. For pain           . IBUPROFEN 200 MG PO TABS   Oral   Take 400 mg by mouth once as needed. For pain          . BACID PO TABS   Oral   Take 2 tablets by mouth 2 (two) times daily.         Marland Kitchen OMEPRAZOLE 10 MG PO CPDR   Oral   Take 10 mg by mouth daily.          . OXYCODONE-ACETAMINOPHEN 5-325 MG PO TABS   Oral   Take 1 tablet by mouth every 6 (six) hours as needed. 1-2 tablets po q 4-6 hours as needed for moderate to severe pain           Triage Vitals: BP 145/86  Pulse 73  Temp 98.8 F (37.1 C) (Oral)  Resp 18  SpO2 100%  Physical Exam  Nursing note and vitals reviewed. Constitutional: He is oriented to person, place, and time. He appears well-developed and well-nourished. No distress.       Pt is afebrile  HENT:  Head: Normocephalic and atraumatic.  Eyes: Conjunctivae normal are normal. Pupils are equal, round, and reactive to light. Right eye exhibits no discharge. Left eye exhibits no discharge. No scleral icterus.  Neck: Normal range of motion. Neck supple. No tracheal deviation present.  Cardiovascular: Normal rate, regular rhythm and normal heart sounds.   Pulmonary/Chest: Effort normal and breath sounds normal. No stridor. No respiratory distress.  Abdominal: Soft. Bowel sounds are normal. There is tenderness (diffuse upper abdominal tenderness, worse on the left).  Musculoskeletal: Normal range of motion. He exhibits no edema.  Neurological: He is alert and oriented to person,  place, and time. He has normal strength. No sensory deficit. He displays no seizure activity.  Skin: Skin is warm and dry. No rash noted.  Psychiatric: He has a normal mood and affect.    ED Course  Procedures (including critical care time)  DIAGNOSTIC STUDIES: Oxygen Saturation is 100% on room air, normal by my interpretation.    COORDINATION OF CARE:  6:30PM- Ordered 4 mg injection of Zofran, 2 mg injection  of Dilaudid and 1000 mL of Bolus.  6:33 PM- Discussed treatment plan which includes pain medication and antiemetic with pt at bedside and pt agreed to plan.  7:44 PM- Pt rechecked and states pain is improved rating his pain a 1 or 2 out of 10. Informed pt of labs that have come back currently. Discussed antibiotic and more fluid until all labs are back.  8:06 PM-Pt rechecked and states pain is still around a 1 or 2 out of 10. Informed pt of lipase level of 1834. Discussed need for admission and pt agreed. Will order more pain medications and discuss pt's case with attending physician.  8:15 PM- Ordered 1 mg injection of Dilaudid.  8:35 PM-Consult complete with Dr. Lovell Sheehan. Patient case explained and discussed. Dr. Lovell Sheehan agrees to admit patient for further evaluation and treatment. Call ended at 845.   Labs Reviewed  CBC WITH DIFFERENTIAL - Abnormal; Notable for the following:    WBC 16.1 (*)     Neutrophils Relative 83 (*)     Neutro Abs 13.4 (*)     Lymphocytes Relative 7 (*)     Monocytes Absolute 1.2 (*)     All other components within normal limits  LIPASE, BLOOD - Abnormal; Notable for the following:    Lipase 1834 (*)     All other components within normal limits  COMPREHENSIVE METABOLIC PANEL   No results found.   No diagnosis found.    MDM  Pancreatitis with lipase of 1834 and wbc 16.1 (probably due to sinus infection). He will be admitted to a med surg bed team 8 Dr. Lovell Sheehan.  Patient has received 2 doses of Dilaudid in the ER today.   Labs Reviewed   CBC WITH DIFFERENTIAL - Abnormal; Notable for the following:    WBC 16.1 (*)     Neutrophils Relative 83 (*)     Neutro Abs 13.4 (*)     Lymphocytes Relative 7 (*)     Monocytes Absolute 1.2 (*)     All other components within normal limits  LIPASE, BLOOD - Abnormal; Notable for the following:    Lipase 1834 (*)     All other components within normal limits  COMPREHENSIVE METABOLIC PANEL     I personally performed the services described in this documentation, which was scribed in my presence. The recorded information has been reviewed and is accurate.       Remi Haggard, NP 06/13/12 1224

## 2012-06-13 LAB — BASIC METABOLIC PANEL
Calcium: 8.2 mg/dL — ABNORMAL LOW (ref 8.4–10.5)
GFR calc non Af Amer: >90 mL/min (ref >90)
Glucose, Bld: 88 mg/dL (ref 70–99)
Sodium: 137 mEq/L (ref 135–145)

## 2012-06-13 LAB — CBC
MCH: 27.1 pg (ref 26.0–34.0)
MCHC: 33.4 g/dL (ref 30.0–36.0)
Platelets: 300 10*3/uL (ref 150–400)
RBC: 4.43 MIL/uL (ref 4.22–5.81)
RDW: 12.7 % (ref 11.5–15.5)

## 2012-06-13 MED ORDER — HYDROMORPHONE HCL PF 1 MG/ML IJ SOLN
1.0000 mg | INTRAMUSCULAR | Status: DC | PRN
  Administered 2012-06-13 – 2012-06-15 (×10): 1 mg via INTRAVENOUS
  Filled 2012-06-13 (×10): qty 1

## 2012-06-13 NOTE — Progress Notes (Signed)
[facility] PROGRESS NOTE  Bryan Alvarado JXB:[id number] DOB: 02/09/1971 DOA: 06/12/2012 PCP: Theda Belfast, MD  Assessment/Plan:  Acute pancreatitis Patient admitted with acute pancreatitis. Continue bowel rest and IV fluids with when necessary pain medications. We'll repeat lipase in the morning. Will also consult Dr. Elnoria Howard in the morning. Cystic fibrosis Chronic sinusitis/ Allergic rhinitis Stable   Code Status: Full Code Family Communication: Discussed with the Patient Disposition Plan: in 2-3 days  Bryan Posey, MD  [facility] Pager [phone]  If 7PM-7AM, please contact night-coverage [email or web address] Password [email or web address] 06/13/2012, 2:37 PM   LOS: 1 day   Brief narrative: Patient is a 41 year old white male with history of cystic fibrosis that comes in with recurrent pancreatitis.  Consultants:  None  Procedures:  None  Antibiotics:  None ?  HPI/Subjective: Feels much better  Objective: Filed Vitals:   06/12/12 2152 06/13/12 0600 06/13/12 0837 06/13/12 1027  BP: 132/83 131/69  112/70  Pulse: 83 64  72  Temp: 98.4 F (36.9 C) 98.2 F (36.8 C)  98.1 F (36.7 C)  TempSrc: Oral Oral  Oral  Resp: 18 18  18   Height: 5\' 8"  (1.727 m)     Weight: 72.122 kg (159 lb)     SpO2: 98% 100% 98% 97%    Intake/Output Summary (Last 24 hours) at 06/13/12 1437 Last data filed at 06/13/12 1239  Gross per 24 hour  Intake 733.33 ml  Output   1500 ml  Net -766.67 ml    Exam:   General: Patient sitting up in bed. He acute distress   Cardiovascular: Regular rate rhythm  Respiratory: Clear to auscultation bilaterally  ABD positive bowel sounds minimal diffuse tenderness  Ext no edema  Data Reviewed: Basic Metabolic Panel:  Lab 06/13/12 8469 06/12/12 1902  NA 137 137  K 4.2 4.1  CL 106 100  CO2 25 27  GLUCOSE 88 94  BUN 8 10  CREATININE 0.79 0.85  CALCIUM 8.2* 9.3  MG -- --  PHOS -- --   Liver Function Tests:  Lab 06/12/12 1902  AST  25  ALT 20  ALKPHOS 68  BILITOT 0.3  PROT 7.4  ALBUMIN 4.2    Lab 06/12/12 1902  LIPASE 1834*  AMYLASE --    CBC:  Lab 06/13/12 0528 06/12/12 1902  WBC 9.9 16.1*  NEUTROABS -- 13.4*  HGB 12.0* 13.9  HCT 35.9* 40.3  MCV 81.0 79.8  PLT 300 344     Recent Results (from the past 240 hour(s))  MRSA PCR SCREENING     Status: Normal   Collection Time   06/12/12 10:18 PM      Component Value Range Status Comment   MRSA by PCR NEGATIVE  NEGATIVE Final       Scheduled Meds:   . [COMPLETED] sodium chloride   Intravenous STAT  . dicloxacillin  500 mg Oral Q6H  . enoxaparin (LOVENOX) injection  40 mg Subcutaneous Q24H  . fluconazole  100 mg Oral QHS  . fluticasone  2 spray Each Nare QHS  . [COMPLETED]  HYDROmorphone (DILAUDID) injection  1 mg Intravenous Once  . [COMPLETED]  HYDROmorphone (DILAUDID) injection  2 mg Intravenous Once  . loratadine  10 mg Oral QHS  . mometasone-formoterol  2 puff Inhalation BID  . [COMPLETED] ondansetron  4 mg Intravenous Once  . pantoprazole (PROTONIX) IV  40 mg Intravenous Q12H  . [COMPLETED] sodium chloride  1,000 mL Intravenous Once   Continuous Infusions:   . sodium chloride 100 mL/hr  at 06/13/12 0900     Time Spent: 25 min

## 2012-06-14 LAB — BASIC METABOLIC PANEL
Calcium: 8.6 mg/dL (ref 8.4–10.5)
GFR calc non Af Amer: >90 mL/min (ref >90)
Sodium: 137 mEq/L (ref 135–145)

## 2012-06-14 LAB — CBC
MCH: 27.2 pg (ref 26.0–34.0)
MCHC: 33 g/dL (ref 30.0–36.0)
Platelets: 276 10*3/uL (ref 150–400)
RDW: 13 % (ref 11.5–15.5)

## 2012-06-14 NOTE — ED Provider Notes (Signed)
Medical screening examination/treatment/procedure(s) were conducted as a shared visit with non-physician practitioner(s) and myself.  I personally evaluated the patient during the encounter  [name], MD 06/14/12 [phone]

## 2012-06-14 NOTE — Progress Notes (Signed)
[facility] PROGRESS NOTE  Bryan Alvarado ZOX:[id number] DOB: 09/28/1970 DOA: 06/12/2012 PCP: Theda Belfast, MD  Assessment/Plan:  Acute pancreatitis Patient admitted with acute pancreatitis. Continue bowel rest and IV fluids with when necessary pain medications. We'll repeat lipase in the morning again. Lipase is decreasing  1834 to 106.  Dr. Elnoria Howard will see patient.  Cystic fibrosis Chronic sinusitis/ Allergic rhinitis Stable   Code Status: Full Code Family Communication: Discussed with the Patient Disposition Plan: in 1-2 days  [name], MD  [facility] Pager [phone]  If 7PM-7AM, please contact night-coverage [email or web address] Password [email or web address] 06/13/2012, 2:37 PM   LOS: 1 day   Brief narrative: Patient is a 41 year old white male with history of cystic fibrosis that comes in with recurrent pancreatitis.  Consultants:  None  Procedures:  None  Antibiotics:  None ?  HPI/Subjective: Feels much better today but still has some pain.  Objective: Filed Vitals:   06/12/12 2152 06/13/12 0600 06/13/12 0837 06/13/12 1027  BP: 132/83 131/69  112/70  Pulse: 83 64  72  Temp: 98.4 F (36.9 C) 98.2 F (36.8 C)  98.1 F (36.7 C)  TempSrc: Oral Oral  Oral  Resp: 18 18  18   Height: 5\' 8"  (1.727 m)     Weight: 72.122 kg (159 lb)     SpO2: 98% 100% 98% 97%    Intake/Output Summary (Last 24 hours) at 06/13/12 1437 Last data filed at 06/13/12 1239  Gross per 24 hour  Intake 733.33 ml  Output   1500 ml  Net -766.67 ml    Exam:   General: Patient sitting up in bed. He acute distress   Cardiovascular: Regular rate rhythm  Respiratory: Clear to auscultation bilaterally  ABD positive bowel sounds minimal diffuse tenderness  Ext no edema  Data Reviewed: Basic Metabolic Panel:  Lab 06/13/12 8295 06/12/12 1902  NA 137 137  K 4.2 4.1  CL 106 100  CO2 25 27  GLUCOSE 88 94  BUN 8 10  CREATININE 0.79 0.85  CALCIUM 8.2* 9.3  MG -- --  PHOS  -- --   Liver Function Tests:  Lab 06/12/12 1902  AST 25  ALT 20  ALKPHOS 68  BILITOT 0.3  PROT 7.4  ALBUMIN 4.2    Lab 06/12/12 1902  LIPASE 1834*  AMYLASE --    CBC:  Lab 06/13/12 0528 06/12/12 1902  WBC 9.9 16.1*  NEUTROABS -- 13.4*  HGB 12.0* 13.9  HCT 35.9* 40.3  MCV 81.0 79.8  PLT 300 344     Recent Results (from the past 240 hour(s))  MRSA PCR SCREENING     Status: Normal   Collection Time   06/12/12 10:18 PM      Component Value Range Status Comment   MRSA by PCR NEGATIVE  NEGATIVE Final       Scheduled Meds:   . [COMPLETED] sodium chloride   Intravenous STAT  . dicloxacillin  500 mg Oral Q6H  . enoxaparin (LOVENOX) injection  40 mg Subcutaneous Q24H  . fluconazole  100 mg Oral QHS  . fluticasone  2 spray Each Nare QHS  . [COMPLETED]  HYDROmorphone (DILAUDID) injection  1 mg Intravenous Once  . [COMPLETED]  HYDROmorphone (DILAUDID) injection  2 mg Intravenous Once  . loratadine  10 mg Oral QHS  . mometasone-formoterol  2 puff Inhalation BID  . [COMPLETED] ondansetron  4 mg Intravenous Once  . pantoprazole (PROTONIX) IV  40 mg Intravenous Q12H  . [COMPLETED] sodium chloride  1,000 mL  Intravenous Once   Continuous Infusions:   . sodium chloride 100 mL/hr at 06/13/12 0900     Time Spent: 25 min

## 2012-06-14 NOTE — Consult Note (Signed)
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  He has a long history of chronic pancreatitis and this year he started to have an increase in his pain.  I provided him with more pain medications, but then he was able to taper down on the amount of pain medications.  During his last visit he was stable and well.  I had referred him back to [facility] for consideration of a Peustow's procedure as a possibility to treat his chronic pancreatitis.  He was supposed to see the surgeon this week, but now it is postponed until February.  The general impression he got from the surgeon was that it would be performed only as a last resort.  Currently he feels well.  He does not know what triggered his pancreatitis episode as it acutely flared from a 4/10 to a 10/10 while he was in the ER.  He is able to tolerate some clear fluids at this time, but he does notice some pain with the PO intake.  Past Medical History  Diagnosis Date  . Cystic fibrosis   . Pancreatic cystic fibrosis   . Sinusitis     Past Surgical History  Procedure Date  . Frontal sinusotomy   . Cholecystectomy     Family History  Problem Relation Age of Onset  . Cancer Mother   . Hyperlipidemia Father   . Hypertension Father     Social History:  reports that he has never smoked. He has never used smokeless tobacco. He reports that he does not drink alcohol or use illicit drugs.  Allergies:  Allergies  Allergen Reactions  . Septra (Bactrim) Hives    Medications:  Scheduled:   . dicloxacillin  500 mg Oral Q6H  . enoxaparin (LOVENOX) injection  40 mg Subcutaneous Q24H  . fluconazole  100 mg Oral QHS  . fluticasone  2 spray Each Nare QHS  . loratadine  10 mg Oral QHS  . mometasone-formoterol  2 puff Inhalation BID  . pantoprazole (PROTONIX) IV  40 mg Intravenous Q12H    Continuous:   . sodium chloride 100 mL/hr at 06/14/12 0947    Results for orders placed during the hospital encounter of 06/12/12 (from the past 24 hour(s))  LIPASE, BLOOD     Status: Abnormal   Collection Time   06/14/12  5:10 AM      Component Value Range   Lipase 106 (*) 11 - 59 U/L  CBC     Status: Abnormal   Collection Time   06/14/12  5:10 AM      Component Value Range   WBC 5.5  4.0 - 10.5 K/uL   RBC 4.23  4.22 - 5.81 MIL/uL   Hemoglobin 11.5 (*) 13.0 - 17.0 g/dL   HCT 16.1 (*) 09.6 - 04.5 %   MCV 82.3  78.0 - 100.0 fL   MCH 27.2  26.0 - 34.0 pg   MCHC 33.0  30.0 - 36.0 g/dL   RDW 40.9  81.1 - 91.4 %   Platelets 276  150 - 400 K/uL  BASIC METABOLIC PANEL     Status: Normal   Collection Time   06/14/12  5:10 AM      Component Value Range   Sodium 137  135 - 145 mEq/L   Potassium 4.3  3.5 - 5.1 mEq/L   Chloride 105  96 -  112 mEq/L   CO2 25  19 - 32 mEq/L   Glucose, Bld 77  70 - 99 mg/dL   BUN 6  6 - 23 mg/dL   Creatinine, Ser 1.61  0.50 - 1.35 mg/dL   Calcium 8.6  8.4 - 09.6 mg/dL   GFR calc non Af Amer >90  >90 mL/min   GFR calc Af Amer >90  >90 mL/min     No results found.  ROS:  As stated above in the HPI otherwise negative.  Blood pressure 115/77, pulse 69, temperature 98 F (36.7 C), temperature source Oral, resp. rate 18, height 5\' 8"  (1.727 m), weight 72.122 kg (159 lb), SpO2 99.00%.    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   He appears stable and with supportive care he should be able to progress through this episode.    Plan: 1) Pain medications. 2) IV hydration. 3) Advance diet as tolerated.  Nielle Duford D 06/14/2012, 2:41 PM

## 2012-06-14 NOTE — Progress Notes (Signed)
Patient evaluated for long-term disease management services with [facility] program as a benefit of his [facility] [employer]. Does not appear patient has any [facility] Care Management needs at this time. However, services were explained and contact information was provided.  Raiford Noble, RN,BSN, MSN [facility] Liaison [phone]

## 2012-06-15 LAB — BASIC METABOLIC PANEL
Chloride: 105 mEq/L (ref 96–112)
Creatinine, Ser: 0.96 mg/dL (ref 0.50–1.35)
GFR calc Af Amer: >90 mL/min (ref >90)
GFR calc non Af Amer: >90 mL/min (ref >90)
Potassium: 4.1 mEq/L (ref 3.5–5.1)

## 2012-06-15 LAB — CBC
Platelets: 297 10*3/uL (ref 150–400)
RDW: 12.7 % (ref 11.5–15.5)
WBC: 6.1 10*3/uL (ref 4.0–10.5)

## 2012-06-15 LAB — LIPASE, BLOOD: Lipase: 88 U/L — ABNORMAL HIGH (ref 11–59)

## 2012-06-15 NOTE — Progress Notes (Signed)
[facility] PROGRESS NOTE  Bryan Alvarado ZOX:[id number] DOB: 40-Dec-1972 DOA: 06/12/2012 PCP: Theda Belfast, MD  Assessment/Plan:  Acute pancreatitis Patient admitted with acute pancreatitis. Patient is improving.  Patient was evaluated by Dr. Elnoria Howard GI.  Patient advanced to full liquid diet. We will saline lock IV. Patient will continue to increase fluid intake by mouth. Patient possible to discharge tomorrow.  Cystic fibrosis Chronic sinusitis/ Allergic rhinitis Stable   Code Status: Full Code Family Communication: Discussed with the Patient Disposition Plan: in AM  [name], MD  [facility] Pager [phone]  If 7PM-7AM, please contact night-coverage [email or web address] Password [email or web address] 06/13/2012, 2:37 PM   LOS: 1 day   Brief narrative: Patient is a 41 year old white male with history of cystic fibrosis that comes in with recurrent pancreatitis. Pancreatitis is improving. Dr. Elnoria Howard GI following patient. Possible discharge tomorrow 11/13.  Consultants:  GI-Dr Elnoria Howard  Procedures:  None  Antibiotics:  None ?  HPI/Subjective: Feels much better today but still has some pain.  Tolerating diet well.  Objective: Filed Vitals:   06/12/12 2152 06/13/12 0600 06/13/12 0837 06/13/12 1027  BP: 132/83 131/69  112/70  Pulse: 83 64  72  Temp: 98.4 F (36.9 C) 98.2 F (36.8 C)  98.1 F (36.7 C)  TempSrc: Oral Oral  Oral  Resp: 18 18  18   Height: 5\' 8"  (1.727 m)     Weight: 72.122 kg (159 lb)     SpO2: 98% 100% 98% 97%    Intake/Output Summary (Last 24 hours) at 06/13/12 1437 Last data filed at 06/13/12 1239  Gross per 24 hour  Intake 733.33 ml  Output   1500 ml  Net -766.67 ml    Exam:   General: Patient sitting up in bed. He acute distress   Cardiovascular: Regular rate rhythm  Respiratory: Clear to auscultation bilaterally  ABD positive bowel sounds minimal diffuse tenderness  Ext no edema  Data Reviewed: Basic Metabolic Panel:  Lab  06/13/12 0528 06/12/12 1902  NA 137 137  K 4.2 4.1  CL 106 100  CO2 25 27  GLUCOSE 88 94  BUN 8 10  CREATININE 0.79 0.85  CALCIUM 8.2* 9.3  MG -- --  PHOS -- --   Liver Function Tests:  Lab 06/12/12 1902  AST 25  ALT 20  ALKPHOS 68  BILITOT 0.3  PROT 7.4  ALBUMIN 4.2    Lab 06/12/12 1902  LIPASE 1834*  AMYLASE --    CBC:  Lab 06/13/12 0528 06/12/12 1902  WBC 9.9 16.1*  NEUTROABS -- 13.4*  HGB 12.0* 13.9  HCT 35.9* 40.3  MCV 81.0 79.8  PLT 300 344     Recent Results (from the past 240 hour(s))  MRSA PCR SCREENING     Status: Normal   Collection Time   06/12/12 10:18 PM      Component Value Range Status Comment   MRSA by PCR NEGATIVE  NEGATIVE Final       Scheduled Meds:   . [COMPLETED] sodium chloride   Intravenous STAT  . dicloxacillin  500 mg Oral Q6H  . enoxaparin (LOVENOX) injection  40 mg Subcutaneous Q24H  . fluconazole  100 mg Oral QHS  . fluticasone  2 spray Each Nare QHS  . [COMPLETED]  HYDROmorphone (DILAUDID) injection  1 mg Intravenous Once  . [COMPLETED]  HYDROmorphone (DILAUDID) injection  2 mg Intravenous Once  . loratadine  10 mg Oral QHS  . mometasone-formoterol  2 puff Inhalation BID  . [COMPLETED] ondansetron  4 mg Intravenous Once  . pantoprazole (PROTONIX) IV  40 mg Intravenous Q12H  . [COMPLETED] sodium chloride  1,000 mL Intravenous Once   Continuous Infusions:   . sodium chloride 100 mL/hr at 06/13/12 0900     Time Spent: 25 min

## 2012-06-15 NOTE — Progress Notes (Signed)
Subjective: No acute events.  Pain is under good control.  Objective: Vital signs in last 24 hours: Temp:  [97.8 F (36.6 C)-98.1 F (36.7 C)] 97.9 F (36.6 C) (11/12 0600) Pulse Rate:  [60-69] 60  (11/12 0600) Resp:  [18-20] 18  (11/12 0600) BP: (114-125)/(69-77) 118/69 mmHg (11/12 0600) SpO2:  [97 %-100 %] 97 % (11/12 0600) Last BM Date: 06/14/12  Intake/Output from previous day: 11/11 0701 - 11/12 0700 In: 2053.3 [I.V.:2053.3] Out: 3300 [Urine:3300] Intake/Output this shift:    General appearance: alert and no distress GI: soft, non-tender; bowel sounds normal; no masses,  no organomegaly  Lab Results:  [facility] 06/15/12 0502 06/14/12 0510 06/13/12 0528  WBC 6.1 5.5 9.9  HGB 12.4* 11.5* 12.0*  HCT 36.3* 34.8* 35.9*  PLT 297 276 300   BMET  Basename 06/15/12 0502 06/14/12 0510 06/13/12 0528  NA 139 137 137  K 4.1 4.3 4.2  CL 105 105 106  CO2 26 25 25   GLUCOSE 88 77 88  BUN 5* 6 8  CREATININE 0.96 0.87 0.79  CALCIUM 8.6 8.6 8.2*   LFT  Basename 06/12/12 1902  PROT 7.4  ALBUMIN 4.2  AST 25  ALT 20  ALKPHOS 68  BILITOT 0.3  BILIDIR --  IBILI --   PT/INR No results found for this basename: LABPROT:2,INR:2 in the last 72 hours Hepatitis Panel No results found for this basename: HEPBSAG,HCVAB,HEPAIGM,HEPBIGM in the last 72 hours C-Diff No results found for this basename: CDIFFTOX:3 in the last 72 hours Fecal Lactopherrin No results found for this basename: FECLLACTOFRN in the last 72 hours  Studies/Results: No results found.  Medications:  Scheduled:   . dicloxacillin  500 mg Oral Q6H  . enoxaparin (LOVENOX) injection  40 mg Subcutaneous Q24H  . fluconazole  100 mg Oral QHS  . fluticasone  2 spray Each Nare QHS  . loratadine  10 mg Oral QHS  . mometasone-formoterol  2 puff Inhalation BID  . pantoprazole (PROTONIX) IV  40 mg Intravenous Q12H   Continuous:   . sodium chloride 75 mL/hr at 06/14/12 1608    Assessment/Plan: 1) Chronic  pancreatitis. 2) Cystic fibrosis.   He is stable at this time and he wants to try full liquids.  Plan: 1) Full liquid diet. 2) Continue with pain medications and IV hydration.  LOS: 3 days   Silvestre Mines D 06/15/2012, 7:25 AM

## 2012-06-15 NOTE — Progress Notes (Signed)
Patient tried the oral pain medication at 1430 but at 1710 states he felt he needed the IV medication for pain because he was hurting more and the oral medication was not helping.  Graded current pain as 5/10 before IV medication

## 2012-06-16 MED ORDER — OXYCODONE HCL 5 MG PO TABS
15.0000 mg | ORAL_TABLET | ORAL | Status: DC | PRN
  Administered 2012-06-16 (×2): 15 mg via ORAL
  Filled 2012-06-16 (×2): qty 3

## 2012-06-16 MED ORDER — OXYCODONE HCL 15 MG PO TABS: 15.0000 mg | ORAL_TABLET | ORAL | Status: DC | PRN

## 2012-06-16 NOTE — Progress Notes (Signed)
Patient to be discharged home. Wife to pick up around 3pm. Discharge instructions and script given with verbal feedback and understanding.  MyChart account set-up for patient.

## 2012-06-16 NOTE — Progress Notes (Signed)
Subjective: Ok at this time.  He required rescue IV dilaudid when percocet failed to control his pain.  Objective: Vital signs in last 24 hours: Temp:  [97.4 F (36.3 C)-98.5 F (36.9 C)] 98 F (36.7 C) (11/13 0628) Pulse Rate:  [56-72] 61  (11/13 0628) Resp:  [18-20] 18  (11/13 0628) BP: (103-123)/(66-81) 122/76 mmHg (11/13 0628) SpO2:  [96 %-99 %] 99 % (11/13 0628) Last BM Date: 06/14/12  Intake/Output from previous day: 11/12 0701 - 11/13 0700 In: -  Out: 2000 [Urine:2000] Intake/Output this shift:    General appearance: alert and no distress GI: soft, non-tender; bowel sounds normal; no masses,  no organomegaly  Lab Results:  [facility] 06/15/12 0502 06/14/12 0510  WBC 6.1 5.5  HGB 12.4* 11.5*  HCT 36.3* 34.8*  PLT 297 276   BMET  Basename 06/15/12 0502 06/14/12 0510  NA 139 137  K 4.1 4.3  CL 105 105  CO2 26 25  GLUCOSE 88 77  BUN 5* 6  CREATININE 0.96 0.87  CALCIUM 8.6 8.6   LFT No results found for this basename: PROT,ALBUMIN,AST,ALT,ALKPHOS,BILITOT,BILIDIR,IBILI in the last 72 hours PT/INR No results found for this basename: LABPROT:2,INR:2 in the last 72 hours Hepatitis Panel No results found for this basename: HEPBSAG,HCVAB,HEPAIGM,HEPBIGM in the last 72 hours C-Diff No results found for this basename: CDIFFTOX:3 in the last 72 hours Fecal Lactopherrin No results found for this basename: FECLLACTOFRN in the last 72 hours  Studies/Results: No results found.  Medications:  Scheduled:   . dicloxacillin  500 mg Oral Q6H  . enoxaparin (LOVENOX) injection  40 mg Subcutaneous Q24H  . fluconazole  100 mg Oral QHS  . fluticasone  2 spray Each Nare QHS  . loratadine  10 mg Oral QHS  . mometasone-formoterol  2 puff Inhalation BID   Continuous:   . [DISCONTINUED] sodium chloride 75 mL (06/15/12 1021)    Assessment/Plan: 1) Chronic pancreatitis. 2) Cystic fibrosis.   The patient is anxious to go home, but he also does not want to be readmitted  for a worsening of his pain.  I feel he should stay another day, but I will leave the decision up to him.  He wants to see how the morning goes with his pain.  If he is discharged today, I recommend 3-5 days of dilaudid 4 mg as well as percocet.    Plan: 1) Continue with pain medications. 2) If he decides to be D/C'ed home please discharge with dilaudid and percocet. 3) I will have him follow up in 1 week.  LOS: 4 days   Adyen Bifulco D 06/16/2012, 7:29 AM

## 2012-06-16 NOTE — Progress Notes (Deleted)
Patient discharged home in stable condition. Discharge instructions given with verbal understanding and feedback. No further questions at this time.  Patient declined MyChart setup

## 2012-06-16 NOTE — Discharge Summary (Signed)
Physician Discharge Summary  Bryan Alvarado UJW:[id number] DOB: 1971-04-15 DOA: 06/12/2012  PCP: Theda Belfast, MD  Admit date: 06/12/2012 Discharge date: 06/16/2012  Time spent: 30 minutes  Recommendations for Outpatient Follow-up:  1. Dr. Elnoria Howard 2 weeks.  Discharge Diagnoses:  Principal Problem:  *Acute pancreatitis Active Problems:  Cystic fibrosis  Chronic sinusitis  Allergic rhinitis   Discharge Condition: stable  Diet recommendation: regular  Filed Weights   06/12/12 2152  Weight: 72.122 kg (159 lb)    History of present illness:  41 y.o. male with a history of Cystic Fibrosis who presents with severe 10/10 epigastric ABD Pain and nausea since the AM. He denies having vomiting or diarrhea. He also denies having any fevers or chills. In the ED, he was found to have a Lipase level of 1834, and he was referred for medical admission. He reports having bouts of pancreatitis since the age of 55.    Hospital Course:  Principal Problem: Acute pancreatitis -treated conservatively with IV fluid and narcotics by the 3rd day pain improved diet advance Narcotics change to orals. -creon increase  Procedures: none Consultations:  GI  Discharge Exam: Filed Vitals:   06/16/12 0200 06/16/12 0628 06/16/12 0917 06/16/12 1028  BP: 106/66 122/76  116/73  Pulse: 59 61  83  Temp: 97.4 F (36.3 C) 98 F (36.7 C)  97.9 F (36.6 C)  TempSrc: Oral Oral  Oral  Resp: 18 18  18   Height:      Weight:      SpO2: 98% 99% 98% 98%    General: A&O x3 Cardiovascular: RRR Respiratory: good air movement CTA b/l  Discharge Instructions  Discharge Orders    Future Orders Please Complete By Expires   Diet - low sodium heart healthy      Increase activity slowly          Medication List     As of 06/16/2012 11:14 AM    TAKE these medications         ADVAIR DISKUS 500-50 MCG/DOSE Aepb   Generic drug: Fluticasone-Salmeterol   Inhale 1 puff into the lungs every 12 (twelve)  hours.      cetirizine 10 MG tablet   Commonly known as: ZYRTEC   Take 10 mg by mouth daily.      fluconazole 100 MG tablet   Commonly known as: DIFLUCAN   Take 100 mg by mouth daily.      fluticasone 50 MCG/ACT nasal spray   Commonly known as: FLONASE   Place 2 sprays into the nose daily.      hydrocodone-acetaminophen 5-500 MG per capsule   Commonly known as: LORCET-HD   Take 1 capsule by mouth 2 (two) times daily as needed. For pain        ibuprofen 200 MG tablet   Commonly known as: ADVIL,MOTRIN   Take 400 mg by mouth once as needed. For pain      lactobacillus acidophilus Tabs   Take 2 tablets by mouth 2 (two) times daily.      omeprazole 10 MG capsule   Commonly known as: PRILOSEC   Take 10 mg by mouth daily.      oxyCODONE 15 MG immediate release tablet   Commonly known as: ROXICODONE   Take 1 tablet (15 mg total) by mouth every 4 (four) hours as needed.      oxyCODONE-acetaminophen 5-325 MG per tablet   Commonly known as: PERCOCET/ROXICET   Take 1 tablet by mouth every 6 (six) hours  as needed. 1-2 tablets po q 4-6 hours as needed for moderate to severe pain      PROAIR HFA 108 (90 BASE) MCG/ACT inhaler   Generic drug: albuterol   Inhale 2 puffs into the lungs 2 (two) times daily as needed. For shortness of breath           Follow-up Information    Follow up with HUNG,PATRICK D, MD. In 2 weeks. (hospital follow up)    Contact information:   [address] [place] [address] [phone]           The results of significant diagnostics from this hospitalization (including imaging, microbiology, ancillary and laboratory) are listed below for reference.    Significant Diagnostic Studies: No results found.  Microbiology: Recent Results (from the past 240 hour(s))  MRSA PCR SCREENING     Status: Normal   Collection Time   06/12/12 10:18 PM      Component Value Range Status Comment   MRSA by PCR NEGATIVE  NEGATIVE Final       Labs: Basic Metabolic Panel:  Lab 06/15/12 9147 06/14/12 0510 06/13/12 0528 06/12/12 1902  NA 139 137 137 137  K 4.1 4.3 4.2 4.1  CL 105 105 106 100  CO2 26 25 25 27   GLUCOSE 88 77 88 94  BUN 5* 6 8 10   CREATININE 0.96 0.87 0.79 0.85  CALCIUM 8.6 8.6 8.2* 9.3  MG -- -- -- --  PHOS -- -- -- --   Liver Function Tests:  Lab 06/12/12 1902  AST 25  ALT 20  ALKPHOS 68  BILITOT 0.3  PROT 7.4  ALBUMIN 4.2    Lab 06/15/12 0502 06/14/12 0510 06/12/12 1902  LIPASE 88* 106* 1834*  AMYLASE -- -- --   No results found for this basename: AMMONIA:5 in the last 168 hours CBC:  Lab 06/15/12 0502 06/14/12 0510 06/13/12 0528 06/12/12 1902  WBC 6.1 5.5 9.9 16.1*  NEUTROABS -- -- -- 13.4*  HGB 12.4* 11.5* 12.0* 13.9  HCT 36.3* 34.8* 35.9* 40.3  MCV 80.7 82.3 81.0 79.8  PLT 297 276 300 344   Cardiac Enzymes: No results found for this basename: CKTOTAL:5,CKMB:5,CKMBINDEX:5,TROPONINI:5 in the last 168 hours BNP: BNP (last 3 results) No results found for this basename: PROBNP:3 in the last 8760 hours CBG: No results found for this basename: GLUCAP:5 in the last 168 hours     Signed:  Marinda Elk  [facility] 06/16/2012, 11:14 AM

## 2012-07-20 ENCOUNTER — Encounter (HOSPITAL_COMMUNITY): Payer: Self-pay | Admitting: *Deleted

## 2012-07-20 ENCOUNTER — Emergency Department (HOSPITAL_COMMUNITY)
Admission: EM | Admit: 2012-07-20 | Discharge: 2012-07-20 | Disposition: A | Discharge status: Home / Self Care | Payer: 59 | Source: Home / Self Care | Attending: Family Medicine | Admitting: Family Medicine

## 2012-07-20 DIAGNOSIS — J069 Acute upper respiratory infection, unspecified: Secondary | ICD-10-CM

## 2012-07-20 LAB — POCT RAPID STREP A: Streptococcus, Group A Screen (Direct): NEGATIVE

## 2012-07-20 MED ORDER — AMOXICILLIN 500 MG PO CAPS: 500.0000 mg | ORAL_CAPSULE | Freq: Three times a day (TID) | ORAL | Status: DC

## 2012-07-20 MED ORDER — IPRATROPIUM BROMIDE 0.06 % NA SOLN: 2.0000 | Freq: Four times a day (QID) | NASAL | Status: DC

## 2012-07-20 NOTE — ED Provider Notes (Signed)
History     CSN: [id number]  Arrival date & time 07/20/12  1517   First MD Initiated Contact with Patient 07/20/12 1525      Chief Complaint  Patient presents with  . Cough    (Consider location/radiation/quality/duration/timing/severity/associated sxs/prior treatment) Patient is a 41 y.o. male presenting with cough. The history is provided by the patient.  Cough This is a new problem. The current episode started more than 2 days ago. The problem has been gradually worsening. The cough is non-productive. There has been no fever. Associated symptoms include chills, ear pain, rhinorrhea and sore throat. He is not a smoker.    Past Medical History  Diagnosis Date  . Cystic fibrosis   . Pancreatic cystic fibrosis   . Sinusitis     Past Surgical History  Procedure Date  . Frontal sinusotomy   . Cholecystectomy     Family History  Problem Relation Age of Onset  . Cancer Mother   . Hyperlipidemia Father   . Hypertension Father     History  Substance Use Topics  . Smoking status: Never Smoker   . Smokeless tobacco: Never Used  . Alcohol Use: No      Review of Systems  Constitutional: Positive for chills and fatigue. Negative for fever.  HENT: Positive for ear pain, congestion, sore throat and rhinorrhea.   Respiratory: Positive for cough.     Allergies  Septra  Home Medications   Current Outpatient Rx  Name  Route  Sig  Dispense  Refill  . ALBUTEROL SULFATE HFA 108 (90 BASE) MCG/ACT IN AERS   Inhalation   Inhale 2 puffs into the lungs 2 (two) times daily as needed. For shortness of breath          . AMOXICILLIN 500 MG PO CAPS   Oral   Take 1 capsule (500 mg total) by mouth 3 (three) times daily.   30 capsule   0   . CETIRIZINE HCL 10 MG PO TABS   Oral   Take 10 mg by mouth daily.           Marland Kitchen FLUCONAZOLE 100 MG PO TABS   Oral   Take 100 mg by mouth daily.         Marland Kitchen FLUTICASONE PROPIONATE 50 MCG/ACT NA SUSP   Nasal   Place 2 sprays into  the nose daily.          Marland Kitchen FLUTICASONE-SALMETEROL 500-50 MCG/DOSE IN AEPB   Inhalation   Inhale 1 puff into the lungs every 12 (twelve) hours.           Marland Kitchen HYDROCODONE-ACETAMINOPHEN 5-500 MG PO CAPS   Oral   Take 1 capsule by mouth 2 (two) times daily as needed. For pain           . IBUPROFEN 200 MG PO TABS   Oral   Take 400 mg by mouth once as needed. For pain          . IPRATROPIUM BROMIDE 0.06 % NA SOLN   Nasal   Place 2 sprays into the nose 4 (four) times daily.   15 mL   1   . BACID PO TABS   Oral   Take 2 tablets by mouth 2 (two) times daily.         Marland Kitchen OMEPRAZOLE 10 MG PO CPDR   Oral   Take 10 mg by mouth daily.          . OXYCODONE HCL 15 MG  PO TABS   Oral   Take 1 tablet (15 mg total) by mouth every 4 (four) hours as needed.   30 tablet   0   . OXYCODONE-ACETAMINOPHEN 5-325 MG PO TABS   Oral   Take 1 tablet by mouth every 6 (six) hours as needed. 1-2 tablets po q 4-6 hours as needed for moderate to severe pain           BP 122/80  Pulse 72  Temp 98.6 F (37 C) (Oral)  Resp 18  SpO2 100%  Physical Exam  Nursing note and vitals reviewed. Constitutional: He is oriented to person, place, and time. He appears well-developed and well-nourished.  HENT:  Head: Normocephalic.  Right Ear: External ear normal.  Left Ear: Tympanic membrane is erythematous. Tympanic membrane mobility is abnormal.  Mouth/Throat: Mucous membranes are normal. Oropharyngeal exudate and posterior oropharyngeal erythema present.  Cardiovascular: Regular rhythm.   Pulmonary/Chest: Breath sounds normal.  Neurological: He is alert and oriented to person, place, and time.  Skin: Skin is warm and dry.    ED Course  Procedures (including critical care time)   Labs Reviewed  POCT RAPID STREP A ([facility] CARE ONLY)   No results found.   1. URI (upper respiratory infection)       MDM          Linna Hoff, MD 07/20/12 [phone]

## 2012-07-20 NOTE — ED Notes (Signed)
Pt  Reports  Symptoms   Of  Cough  /  Congestion        sorethroat  As   Well  As  A  l  Earache   Symptoms  4  Days         Symptoms  Not  releived  By  otc   meds

## 2012-08-05 ENCOUNTER — Emergency Department (HOSPITAL_COMMUNITY)
Admission: EM | Admit: 2012-08-05 | Discharge: 2012-08-05 | Discharge status: Against Medical Advice | Payer: 59 | Attending: Emergency Medicine | Admitting: Emergency Medicine

## 2012-08-05 ENCOUNTER — Encounter (HOSPITAL_COMMUNITY): Payer: Self-pay | Admitting: Emergency Medicine

## 2012-08-05 DIAGNOSIS — R1084 Generalized abdominal pain: Secondary | ICD-10-CM | POA: Insufficient documentation

## 2012-08-05 NOTE — ED Notes (Signed)
Pt states his pain feels better and he doesn't want to wait any longer to be seen.

## 2012-08-05 NOTE — ED Notes (Signed)
Pt alert, arrives from home, c/o gen abd pain, per pt "i have a hx of pancreatitis, i think i am having an acute episode", describes pain as sharp and achy, resp even unlabored, skin pwd

## 2012-08-06 ENCOUNTER — Emergency Department (HOSPITAL_COMMUNITY)
Admission: EM | Admit: 2012-08-06 | Discharge: 2012-08-06 | Disposition: A | Discharge status: Home / Self Care | Payer: 59 | Source: Home / Self Care | Attending: Family Medicine | Admitting: Family Medicine

## 2012-08-06 ENCOUNTER — Encounter (HOSPITAL_COMMUNITY): Payer: Self-pay | Admitting: *Deleted

## 2012-08-06 DIAGNOSIS — K861 Other chronic pancreatitis: Secondary | ICD-10-CM

## 2012-08-06 MED ORDER — OXYCODONE-ACETAMINOPHEN 5-325 MG PO TABS: 1.0000 | ORAL_TABLET | Freq: Four times a day (QID) | ORAL | Status: DC | PRN

## 2012-08-06 MED ORDER — ONDANSETRON 4 MG PO TBDP
4.0000 mg | ORAL_TABLET | Freq: Once | ORAL | Status: AC
  Administered 2012-08-06: 4 mg via ORAL

## 2012-08-06 MED ORDER — ONDANSETRON HCL 4 MG PO TABS: 4.0000 mg | ORAL_TABLET | Freq: Four times a day (QID) | ORAL | Status: DC

## 2012-08-06 MED ORDER — ONDANSETRON 4 MG PO TBDP
ORAL_TABLET | ORAL | Status: AC
  Filled 2012-08-06: qty 1

## 2012-08-06 NOTE — ED Provider Notes (Signed)
History     CSN: [id number]  Arrival date & time 08/06/12  1051   First MD Initiated Contact with Patient 08/06/12 1225      Chief Complaint  Patient presents with  . Abdominal Pain    (Consider location/radiation/quality/duration/timing/severity/associated sxs/prior treatment) Patient is a 42 y.o. male presenting with abdominal pain. The history is provided by the patient.  Abdominal Pain The primary symptoms of the illness include abdominal pain and nausea. The primary symptoms of the illness do not include fever, vomiting, diarrhea or hematochezia. Primary symptoms comment: h/o pancreatitis, followed by dr hung, left er last eve after pain med relieved sx. The current episode started yesterday. The problem has been gradually improving.  The patient has not had a change in bowel habit. Symptoms associated with the illness do not include constipation. Associated medical issues comments: pancreatitis.    Past Medical History  Diagnosis Date  . Cystic fibrosis   . Pancreatic cystic fibrosis   . Sinusitis     Past Surgical History  Procedure Date  . Frontal sinusotomy   . Cholecystectomy     Family History  Problem Relation Age of Onset  . Cancer Mother   . Hyperlipidemia Father   . Hypertension Father     History  Substance Use Topics  . Smoking status: Never Smoker   . Smokeless tobacco: Never Used  . Alcohol Use: No      Review of Systems  Constitutional: Negative.  Negative for fever.  Gastrointestinal: Positive for nausea and abdominal pain. Negative for vomiting, diarrhea, constipation, blood in stool and hematochezia.    Allergies  Septra  Home Medications   Current Outpatient Rx  Name  Route  Sig  Dispense  Refill  . ALBUTEROL SULFATE HFA 108 (90 BASE) MCG/ACT IN AERS   Inhalation   Inhale 2 puffs into the lungs 2 (two) times daily as needed. For shortness of breath          . CETIRIZINE HCL 10 MG PO TABS   Oral   Take 10 mg by mouth daily.             Marland Kitchen FLUTICASONE PROPIONATE 50 MCG/ACT NA SUSP   Nasal   Place 2 sprays into the nose daily.          Marland Kitchen FLUTICASONE-SALMETEROL 500-50 MCG/DOSE IN AEPB   Inhalation   Inhale 1 puff into the lungs every 12 (twelve) hours.           Marland Kitchen HYDROCODONE-ACETAMINOPHEN 5-500 MG PO CAPS   Oral   Take 1 capsule by mouth 2 (two) times daily as needed. For pain           . IBUPROFEN 200 MG PO TABS   Oral   Take 400 mg by mouth once as needed. For pain          . IPRATROPIUM BROMIDE 0.06 % NA SOLN   Nasal   Place 2 sprays into the nose 4 (four) times daily.   15 mL   1   . BACID PO TABS   Oral   Take 2 tablets by mouth 2 (two) times daily.         Marland Kitchen OMEPRAZOLE 10 MG PO CPDR   Oral   Take 10 mg by mouth daily.          Marland Kitchen ONDANSETRON HCL 4 MG PO TABS   Oral   Take 1 tablet (4 mg total) by mouth every 6 (six) hours. As needed  for n/v   12 tablet   0   . OXYCODONE HCL 15 MG PO TABS   Oral   Take 1 tablet (15 mg total) by mouth every 4 (four) hours as needed.   30 tablet   0   . OXYCODONE-ACETAMINOPHEN 5-325 MG PO TABS   Oral   Take 1 tablet by mouth every 6 (six) hours as needed for pain.   10 tablet   0     BP 137/91  Pulse 75  Temp 98.4 F (36.9 C) (Oral)  Resp 16  SpO2 100%  Physical Exam  Nursing note and vitals reviewed. Constitutional: He is oriented to person, place, and time. He appears well-developed and well-nourished.  HENT:  Mouth/Throat: Oropharynx is clear and moist.  Eyes: Conjunctivae normal are normal. Pupils are equal, round, and reactive to light.  Neck: Normal range of motion. Neck supple.  Cardiovascular: Normal rate and regular rhythm.   Pulmonary/Chest: Breath sounds normal.  Abdominal: Soft. Bowel sounds are normal. He exhibits no distension and no mass. There is no tenderness. There is no rebound and no guarding.  Lymphadenopathy:    He has no cervical adenopathy.  Neurological: He is alert and oriented to person, place,  and time.  Skin: Skin is warm and dry.    ED Course  Procedures (including critical care time)  Labs Reviewed - No data to display No results found.   1. Chronic relapsing pancreatitis       MDM          Linna Hoff, MD 08/06/12 1255

## 2012-08-06 NOTE — ED Notes (Signed)
Pt  Reports   He   Has  A  History  Of  Pancreatitis   -  He  [place] Spaniel  He  Had    Intense  Pain  Last  Pm  -  Went to  hosp  Last  Pm   But left  ama   He  States  Pain is  Better  Today  He  Rates  It  At a  3-4   He     States  Some  Nausea   No  Vomiting  He  States  His   GI  Doctor  Is  Systems developer of town  This     Weekend  And  The patient is  requesting  Pain  Rx

## 2012-08-24 ENCOUNTER — Ambulatory Visit: Payer: 59 | Admitting: Internal Medicine

## 2012-10-27 ENCOUNTER — Inpatient Hospital Stay (HOSPITAL_COMMUNITY)
Admission: EM | Admit: 2012-10-27 | Discharge: 2012-10-31 | DRG: 439 | Disposition: A | Discharge status: Home / Self Care | Payer: Commercial Managed Care - PPO | Attending: Internal Medicine | Admitting: Internal Medicine

## 2012-10-27 ENCOUNTER — Encounter (HOSPITAL_COMMUNITY): Payer: Self-pay | Admitting: General Practice

## 2012-10-27 ENCOUNTER — Emergency Department (HOSPITAL_COMMUNITY): Payer: Commercial Managed Care - PPO

## 2012-10-27 DIAGNOSIS — Z79899 Other long term (current) drug therapy: Secondary | ICD-10-CM

## 2012-10-27 DIAGNOSIS — Z8701 Personal history of pneumonia (recurrent): Secondary | ICD-10-CM

## 2012-10-27 DIAGNOSIS — K859 Acute pancreatitis without necrosis or infection, unspecified: Principal | ICD-10-CM | POA: Diagnosis present

## 2012-10-27 DIAGNOSIS — J309 Allergic rhinitis, unspecified: Secondary | ICD-10-CM

## 2012-10-27 DIAGNOSIS — D72829 Elevated white blood cell count, unspecified: Secondary | ICD-10-CM | POA: Diagnosis present

## 2012-10-27 DIAGNOSIS — J329 Chronic sinusitis, unspecified: Secondary | ICD-10-CM

## 2012-10-27 LAB — CBC WITH DIFFERENTIAL/PLATELET
Basophils Absolute: 0 10*3/uL (ref 0.0–0.1)
Basophils Relative: 0 % (ref 0–1)
Eosinophils Absolute: 0 10*3/uL (ref 0.0–0.7)
Eosinophils Absolute: 0 10*3/uL (ref 0.0–0.7)
Eosinophils Relative: 0 % (ref 0–5)
Eosinophils Relative: 0 % (ref 0–5)
HCT: 39.7 % (ref 39.0–52.0)
HCT: 35.3 % — ABNORMAL LOW (ref 39.0–52.0)
Lymphs Abs: 2.1 10*3/uL (ref 0.7–4.0)
MCH: 26.5 pg (ref 26.0–34.0)
MCHC: 34 g/dL (ref 30.0–36.0)
MCV: 78.6 fL (ref 78.0–100.0)
MCV: 81 fL (ref 78.0–100.0)
Monocytes Absolute: 2.4 10*3/uL — ABNORMAL HIGH (ref 0.1–1.0)
Monocytes Absolute: 2.2 10*3/uL — ABNORMAL HIGH (ref 0.1–1.0)
Platelets: 543 10*3/uL — ABNORMAL HIGH (ref 150–400)
RBC: 5.05 MIL/uL (ref 4.22–5.81)
RDW: 13.6 % (ref 11.5–15.5)

## 2012-10-27 LAB — COMPREHENSIVE METABOLIC PANEL
ALT: 17 U/L (ref 0–53)
ALT: 12 U/L (ref 0–53)
AST: 12 U/L (ref 0–37)
BUN: 17 mg/dL (ref 6–23)
CO2: 20 mEq/L (ref 19–32)
CO2: 27 mEq/L (ref 19–32)
Calcium: 9.3 mg/dL (ref 8.4–10.5)
Chloride: 103 mEq/L (ref 96–112)
Creatinine, Ser: 0.78 mg/dL (ref 0.50–1.35)
Creatinine, Ser: 0.79 mg/dL (ref 0.50–1.35)
GFR calc Af Amer: >90 mL/min (ref >90)
GFR calc non Af Amer: >90 mL/min (ref >90)
GFR calc non Af Amer: >90 mL/min (ref >90)
Glucose, Bld: 129 mg/dL — ABNORMAL HIGH (ref 70–99)
Glucose, Bld: 99 mg/dL (ref 70–99)
Sodium: 136 mEq/L (ref 135–145)
Sodium: 137 mEq/L (ref 135–145)
Total Bilirubin: 0.4 mg/dL (ref 0.3–1.2)
Total Protein: 7.4 g/dL (ref 6.0–8.3)

## 2012-10-27 LAB — RAPID URINE DRUG SCREEN, HOSP PERFORMED
Benzodiazepines: NOT DETECTED
Opiates: POSITIVE — AB

## 2012-10-27 LAB — URINALYSIS, ROUTINE W REFLEX MICROSCOPIC
Leukocytes, UA: NEGATIVE
Nitrite: NEGATIVE
Protein, ur: NEGATIVE mg/dL
Specific Gravity, Urine: 1.022 (ref 1.005–1.030)
Urobilinogen, UA: 0.2 mg/dL (ref 0.0–1.0)

## 2012-10-27 LAB — LIPASE, BLOOD: Lipase: >3000 U/L — ABNORMAL HIGH (ref 11–59)

## 2012-10-27 LAB — ETHANOL: Alcohol, Ethyl (B): <11 mg/dL (ref 0–11)

## 2012-10-27 LAB — LACTATE DEHYDROGENASE: LDH: 273 U/L — ABNORMAL HIGH (ref 94–250)

## 2012-10-27 MED ORDER — BACID PO TABS
2.0000 | ORAL_TABLET | Freq: Two times a day (BID) | ORAL | Status: DC
  Administered 2012-10-27 – 2012-10-29 (×3): 2 via ORAL
  Filled 2012-10-27 (×9): qty 2

## 2012-10-27 MED ORDER — LORAZEPAM 2 MG/ML IJ SOLN
1.0000 mg | Freq: Three times a day (TID) | INTRAMUSCULAR | Status: DC | PRN
  Administered 2012-10-27 – 2012-10-30 (×4): 1 mg via INTRAVENOUS
  Filled 2012-10-27 (×5): qty 1

## 2012-10-27 MED ORDER — SODIUM CHLORIDE 0.9 % IV SOLN: INTRAVENOUS | Status: DC

## 2012-10-27 MED ORDER — SODIUM CHLORIDE 0.9 % IV BOLUS (SEPSIS)
1000.0000 mL | Freq: Once | INTRAVENOUS | Status: AC
  Administered 2012-10-27: 1000 mL via INTRAVENOUS

## 2012-10-27 MED ORDER — IPRATROPIUM BROMIDE 0.03 % NA SOLN
4.0000 | Freq: Four times a day (QID) | NASAL | Status: DC
  Administered 2012-10-29 (×2): 4 via NASAL
  Filled 2012-10-27: qty 30

## 2012-10-27 MED ORDER — HYDROMORPHONE HCL PF 2 MG/ML IJ SOLN
2.0000 mg | Freq: Once | INTRAMUSCULAR | Status: AC
  Administered 2012-10-27: 2 mg via INTRAVENOUS
  Filled 2012-10-27: qty 1

## 2012-10-27 MED ORDER — SODIUM CHLORIDE 0.9 % IV SOLN
INTRAVENOUS | Status: DC
  Administered 2012-10-27: 16:00:00 via INTRAVENOUS

## 2012-10-27 MED ORDER — DOXYCYCLINE HYCLATE 100 MG PO TABS
100.0000 mg | ORAL_TABLET | Freq: Two times a day (BID) | ORAL | Status: DC
  Administered 2012-10-27 – 2012-10-31 (×8): 100 mg via ORAL
  Filled 2012-10-27 (×9): qty 1

## 2012-10-27 MED ORDER — LEVOFLOXACIN 750 MG PO TABS
750.0000 mg | ORAL_TABLET | Freq: Every day | ORAL | Status: DC
  Administered 2012-10-28 – 2012-10-31 (×4): 750 mg via ORAL
  Filled 2012-10-27 (×4): qty 1

## 2012-10-27 MED ORDER — ONDANSETRON HCL 4 MG/2ML IJ SOLN
4.0000 mg | Freq: Four times a day (QID) | INTRAMUSCULAR | Status: DC | PRN
  Administered 2012-10-27 – 2012-10-28 (×2): 4 mg via INTRAVENOUS
  Filled 2012-10-27 (×3): qty 2

## 2012-10-27 MED ORDER — HYDROMORPHONE HCL PF 2 MG/ML IJ SOLN
2.0000 mg | INTRAMUSCULAR | Status: DC | PRN
  Administered 2012-10-27: 2 mg via INTRAVENOUS
  Filled 2012-10-27: qty 1

## 2012-10-27 MED ORDER — LORATADINE 10 MG PO TABS
10.0000 mg | ORAL_TABLET | Freq: Every day | ORAL | Status: DC
  Administered 2012-10-30 – 2012-10-31 (×2): 10 mg via ORAL
  Filled 2012-10-27 (×5): qty 1

## 2012-10-27 MED ORDER — IPRATROPIUM BROMIDE 0.06 % NA SOLN
2.0000 | Freq: Four times a day (QID) | NASAL | Status: DC
Start: 2012-10-27 — End: 2012-10-27
  Filled 2012-10-27: qty 15

## 2012-10-27 MED ORDER — IOHEXOL 300 MG/ML  SOLN
50.0000 mL | Freq: Once | INTRAMUSCULAR | Status: AC | PRN
  Administered 2012-10-27: 50 mL via ORAL

## 2012-10-27 MED ORDER — ONDANSETRON HCL 4 MG/2ML IJ SOLN
4.0000 mg | Freq: Once | INTRAMUSCULAR | Status: AC
  Administered 2012-10-27: 4 mg via INTRAVENOUS
  Filled 2012-10-27: qty 2

## 2012-10-27 MED ORDER — ZOLPIDEM TARTRATE 5 MG PO TABS
5.0000 mg | ORAL_TABLET | Freq: Every day | ORAL | Status: DC
  Administered 2012-10-28: 5 mg via ORAL
  Filled 2012-10-27 (×2): qty 1

## 2012-10-27 MED ORDER — IOHEXOL 300 MG/ML  SOLN
100.0000 mL | Freq: Once | INTRAMUSCULAR | Status: AC | PRN
  Administered 2012-10-27: 100 mL via INTRAVENOUS

## 2012-10-27 MED ORDER — FLUTICASONE PROPIONATE 50 MCG/ACT NA SUSP
2.0000 | Freq: Every day | NASAL | Status: DC
Start: 2012-10-27 — End: 2012-10-31
  Administered 2012-10-28 – 2012-10-31 (×4): 2 via NASAL
  Filled 2012-10-27: qty 16

## 2012-10-27 MED ORDER — ACETAMINOPHEN 650 MG RE SUPP: 650.0000 mg | Freq: Four times a day (QID) | RECTAL | Status: DC | PRN

## 2012-10-27 MED ORDER — HYDROMORPHONE HCL PF 1 MG/ML IJ SOLN: 1.0000 mg | INTRAMUSCULAR | Status: DC | PRN

## 2012-10-27 MED ORDER — MORPHINE SULFATE 4 MG/ML IJ SOLN
4.0000 mg | Freq: Once | INTRAMUSCULAR | Status: AC
  Administered 2012-10-27: 4 mg via INTRAVENOUS
  Filled 2012-10-27: qty 1

## 2012-10-27 MED ORDER — PANCRELIPASE (LIP-PROT-AMYL) 12000-38000 UNITS PO CPEP
1.0000 | ORAL_CAPSULE | Freq: Three times a day (TID) | ORAL | Status: DC
  Filled 2012-10-27 (×2): qty 1

## 2012-10-27 MED ORDER — ACETAMINOPHEN 325 MG PO TABS: 650.0000 mg | ORAL_TABLET | Freq: Four times a day (QID) | ORAL | Status: DC | PRN

## 2012-10-27 MED ORDER — ONDANSETRON HCL 4 MG PO TABS: 4.0000 mg | ORAL_TABLET | Freq: Four times a day (QID) | ORAL | Status: DC | PRN

## 2012-10-27 MED ORDER — ONDANSETRON HCL 4 MG/2ML IJ SOLN: 4.0000 mg | Freq: Three times a day (TID) | INTRAMUSCULAR | Status: DC | PRN

## 2012-10-27 MED ORDER — PREDNISONE 10 MG PO TABS
10.0000 mg | ORAL_TABLET | Freq: Two times a day (BID) | ORAL | Status: AC
  Administered 2012-10-28: 10 mg via ORAL
  Filled 2012-10-27 (×3): qty 1

## 2012-10-27 MED ORDER — PREDNISONE (PAK) 10 MG PO TABS: 10.0000 mg | ORAL_TABLET | Freq: Every day | ORAL | Status: DC

## 2012-10-27 MED ORDER — MOMETASONE FURO-FORMOTEROL FUM 200-5 MCG/ACT IN AERO
2.0000 | INHALATION_SPRAY | Freq: Two times a day (BID) | RESPIRATORY_TRACT | Status: DC
  Administered 2012-10-27 – 2012-10-31 (×8): 2 via RESPIRATORY_TRACT
  Filled 2012-10-27: qty 8.8

## 2012-10-27 MED ORDER — SODIUM CHLORIDE 0.9 % IV SOLN
INTRAVENOUS | Status: DC
  Administered 2012-10-27 – 2012-10-31 (×5): via INTRAVENOUS

## 2012-10-27 MED ORDER — ALBUTEROL SULFATE HFA 108 (90 BASE) MCG/ACT IN AERS
2.0000 | INHALATION_SPRAY | Freq: Two times a day (BID) | RESPIRATORY_TRACT | Status: DC | PRN
  Administered 2012-10-30: 2 via RESPIRATORY_TRACT
  Filled 2012-10-27: qty 6.7

## 2012-10-27 MED ORDER — PREDNISONE 10 MG PO TABS
10.0000 mg | ORAL_TABLET | Freq: Three times a day (TID) | ORAL | Status: AC
  Administered 2012-10-27: 10 mg via ORAL
  Filled 2012-10-27 (×2): qty 1

## 2012-10-27 MED ORDER — PREDNISONE 10 MG PO TABS
10.0000 mg | ORAL_TABLET | Freq: Every day | ORAL | Status: AC
  Filled 2012-10-27: qty 1

## 2012-10-27 MED ORDER — HYDROMORPHONE HCL PF 2 MG/ML IJ SOLN
4.0000 mg | INTRAMUSCULAR | Status: DC | PRN
Start: 2012-10-27 — End: 2012-10-31
  Administered 2012-10-27 – 2012-10-28 (×2): 4 mg via INTRAVENOUS
  Administered 2012-10-28 (×2): 2 mg via INTRAVENOUS
  Administered 2012-10-28 (×3): 4 mg via INTRAVENOUS
  Administered 2012-10-28 (×2): 2 mg via INTRAVENOUS
  Administered 2012-10-29 (×2): 4 mg via INTRAVENOUS
  Administered 2012-10-29: 2 mg via INTRAVENOUS
  Administered 2012-10-29: 4 mg via INTRAVENOUS
  Administered 2012-10-29 (×2): 2 mg via INTRAVENOUS
  Administered 2012-10-29: 4 mg via INTRAVENOUS
  Administered 2012-10-30: 2 mg via INTRAVENOUS
  Administered 2012-10-30 (×2): 4 mg via INTRAVENOUS
  Administered 2012-10-30: 2 mg via INTRAVENOUS
  Administered 2012-10-30: 4 mg via INTRAVENOUS
  Administered 2012-10-30 – 2012-10-31 (×2): 2 mg via INTRAVENOUS
  Filled 2012-10-27: qty 1
  Filled 2012-10-27: qty 2
  Filled 2012-10-27: qty 1
  Filled 2012-10-27 (×2): qty 2
  Filled 2012-10-27 (×2): qty 1
  Filled 2012-10-27 (×2): qty 2
  Filled 2012-10-27: qty 1
  Filled 2012-10-27: qty 2
  Filled 2012-10-27: qty 1
  Filled 2012-10-27: qty 2
  Filled 2012-10-27 (×4): qty 1
  Filled 2012-10-27: qty 2
  Filled 2012-10-27 (×3): qty 1
  Filled 2012-10-27 (×2): qty 2
  Filled 2012-10-27: qty 1
  Filled 2012-10-27: qty 2

## 2012-10-27 NOTE — ED Notes (Signed)
Patient reports abdominal pain 10/10, gradual onset starting 1 hour ago. Pt has hx on Pancreatic Cystic Fibrosis. Nausea, denies v/d

## 2012-10-27 NOTE — ED Notes (Addendum)
Report given to [place], rn on floor.

## 2012-10-27 NOTE — ED Notes (Signed)
Pt given an urinal upon arrival to room, pt unable to void, attempting again at present

## 2012-10-27 NOTE — ED Notes (Signed)
PA made aware pt unable to urinate and that pt vomited again

## 2012-10-27 NOTE — ED Notes (Signed)
Patient is aware that a urine sample is needed. Patient stated that he is unable to void at this time. A urinal was placed at bedside

## 2012-10-27 NOTE — H&P (Addendum)
[facility] History and Physical  Roch Quach WUJ:[id number] DOB: [birth year] DOA: 10/27/2012  Referring physician: ER physician PCP: Theda Belfast, MD   Chief Complaint: abdominal pain  HPI:  42 year old male with past medical history of cystic fibrosis and pancreatitis related to this diagnosis who presented to [facility] ED 10/27/12 with worsening periumbilical pain for past few days prior to this admission. Pain is 10/10 in intensity, non radiating, constant and not relieved with home analgesics. Patient also reported associated nausea and vomiting and poor oral intake. No specific aggravating factors reported. Patient however did report abdominal pain relatively improved with IV pain medications given in ED. In ED, evaluation included CT abdomen which was concerning for acute pancreatitis. His Lipase level was above 3,000 and WBC count was elevated at 21.7.Liver function tests were all WNL. Patient was given morphine 4 mg IV once with no significant improvement in abdominal pain. Then he was given dilaudid 2 mg IV x 2 doses total which did improve the level of pain to about 6/10 in intensity.   Assessment and Plan:  Principal Problem:   Acute pancreatitis  In the setting of long standing history of cystic fibrosis.  Continue IV fluids, antiemetics and pain management with dilaudid 2 mg IV  Q 2 hours PRN severe pain  Lipase level on admission >3,000. Follow up lipase level in am  Check UDS and ethanol level  Add pancreatic lipases supplements 1 tablet TID Active Problems:   Leukocytosis  Secondary to acute pancreatitis  Code Status: Full Family Communication: Pt at bedside Disposition Plan: Admit for further evaluation  Manson Passey, MD  [facility] Pager [phone]  If 7PM-7AM, please contact night-coverage [email or web address] Password [email or web address] 10/27/2012, 3:53 PM   Review of Systems:  Constitutional: Negative for fever, chills and malaise/fatigue. Negative for diaphoresis.   HENT: Negative for hearing loss, ear pain, nosebleeds, congestion, sore throat, neck pain, tinnitus and ear discharge.  Eyes: Negative for blurred vision, double vision, photophobia, pain, discharge and redness.  Respiratory: Negative for cough, hemoptysis, sputum production, shortness of breath, wheezing and stridor.   Cardiovascular: Negative for chest pain, palpitations, orthopnea, claudication and leg swelling.  Gastrointestinal: positive for nausea, vomiting and abdominal pain. Negative for heartburn, constipation, blood in stool and melena.  Genitourinary: Negative for dysuria, urgency, frequency, hematuria and flank pain.  Musculoskeletal: Negative for myalgias, back pain, joint pain and falls.  Skin: Negative for itching and rash.  Neurological: Negative for dizziness and weakness. Negative for tingling, tremors, sensory change, speech change, focal weakness, loss of consciousness and headaches.  Endo/Heme/Allergies: Negative for environmental allergies and polydipsia. Does not bruise/bleed easily.  Psychiatric/Behavioral: Negative for suicidal ideas. The patient is not nervous/anxious.      Past Medical History  Diagnosis Date  . Cystic fibrosis   . Pancreatic cystic fibrosis   . Sinusitis    Past Surgical History  Procedure Laterality Date  . Frontal sinusotomy    . Cholecystectomy     Social History:  reports that he has never smoked. He has never used smokeless tobacco. He reports that he does not drink alcohol or use illicit drugs.  Allergies  Allergen Reactions  . Septra (Bactrim) Hives    Family History:  Family History  Problem Relation Age of Onset  . Cancer Mother   . Hyperlipidemia Father   . Hypertension Father      Prior to Admission medications   Medication Sig Start Date End Date Taking? Authorizing Provider  albuterol (PROAIR HFA) 108 (90  BASE) MCG/ACT inhaler Inhale 2 puffs into the lungs 2 (two) times daily as needed. For shortness of breath     Yes Historical Provider, MD  cetirizine (ZYRTEC) 10 MG tablet Take 10 mg by mouth daily.     Yes Historical Provider, MD  fluticasone (FLONASE) 50 MCG/ACT nasal spray Place 2 sprays into the nose daily.    Yes Historical Provider, MD  Fluticasone-Salmeterol (ADVAIR DISKUS) 500-50 MCG/DOSE AEPB Inhale 1 puff into the lungs every 12 (twelve) hours.     Yes Historical Provider, MD  hydrocodone-acetaminophen (LORCET-HD) 5-500 MG per capsule Take 1 capsule by mouth 2 (two) times daily as needed. For pain     Yes Historical Provider, MD  ibuprofen (ADVIL,MOTRIN) 200 MG tablet Take 400 mg by mouth once as needed. For pain    Yes Historical Provider, MD  ipratropium (ATROVENT) 0.06 % nasal spray Place 2 sprays into the nose 4 (four) times daily. 07/20/12  Yes Linna Hoff, MD  lactobacillus acidophilus (BACID) TABS Take 2 tablets by mouth 2 (two) times daily.   Yes Historical Provider, MD  omeprazole (PRILOSEC) 10 MG capsule Take 10 mg by mouth daily.    Yes Historical Provider, MD   Physical Exam: Filed Vitals:   10/27/12 1200 10/27/12 1231 10/27/12 1255 10/27/12 1330  BP: 131/75 155/79  118/69  Pulse: 65 79 65 66  Temp:      TempSrc:      Resp:  16  16  SpO2: 100% 100% 100% 100%    Physical Exam  Constitutional: Appears well-developed and well-nourished. No distress.  HENT: Normocephalic. External right and left ear normal. Oropharynx is clear and moist.  Eyes: Conjunctivae and EOM are normal. PERRLA, no scleral icterus.  Neck: Normal ROM. Neck supple. No JVD. No tracheal deviation. No thyromegaly.  CVS: RRR, S1/S2 +, no murmurs, no gallops, no carotid bruit.  Pulmonary: Effort and breath sounds normal, no stridor, rhonchi, wheezes, rales.  Abdominal: Soft. BS +,  no distension, tenderness appreciated aorss mid abdomen, no rebound or guarding.  Musculoskeletal: Normal range of motion. No edema and no tenderness.  Lymphadenopathy: No lymphadenopathy noted, cervical, inguinal. Neuro: Alert.  Normal reflexes, muscle tone coordination. No cranial nerve deficit. Skin: Skin is warm and dry. No rash noted. Not diaphoretic. No erythema. No pallor.  Psychiatric: Normal mood and affect. Behavior, judgment, thought content normal.   Labs on Admission:  Basic Metabolic Panel:  Recent Labs Lab 10/27/12 1140  NA 136  K 3.6  CL 100  CO2 20  GLUCOSE 129*  BUN 17  CREATININE 0.78  CALCIUM 9.3   Liver Function Tests:  Recent Labs Lab 10/27/12 1140  AST 21  ALT 17  ALKPHOS 75  BILITOT 0.5  PROT 7.4  ALBUMIN 3.7    Recent Labs Lab 10/27/12 1140  LIPASE >3000*   No results found for this basename: AMMONIA,  in the last 168 hours CBC:  Recent Labs Lab 10/27/12 1140  WBC 21.7*  NEUTROABS 17.2*  HGB 13.4  HCT 39.7  MCV 78.6  PLT 543*   Radiological Exams on Admission: Ct Abdomen Pelvis W Contrast 10/27/2012  * IMPRESSION:  1.  Haziness surrounding the pancreas is indicative of acute pancreatitis. 2.  Pancreatic ductal dilatation, as before, with tortuosity and prominence in the uncinate process region.  Difficult to definitively exclude a true lesion early necrosis in this area. 3.  Duodenal thickening is likely secondary to pancreatic inflammation. 4.  Mild small bowel mesenteric haziness and  sub centimeter nodularity, nonspecific.     EKG: Normal sinus rhythm, no ST/T wave changes  Time spent: 75 minutes

## 2012-10-27 NOTE — ED Notes (Signed)
Pt back from CT

## 2012-10-27 NOTE — ED Notes (Signed)
Pt to CT

## 2012-10-27 NOTE — ED Provider Notes (Signed)
History     CSN: [id number]  Arrival date & time 10/27/12  1119   First MD Initiated Contact with Patient 10/27/12 1132      Chief Complaint  Patient presents with  . Abdominal Pain    (Consider location/radiation/quality/duration/timing/severity/associated sxs/prior treatment) HPI Comments: Pt presenting to the ED for sharp, non-radiating, epigastric abdominal pain increasing in intensity since this morning associated with nausea and vomiting.  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  Currently seeing Dr. Dareen Piano with [facility] who is looking for alternative treatments to better maintain his sx.  Currently scheduled for surgery with stent placement of pancreatic duct due to narrowing. Pt denies any chest pain, SOB, dizziness, palpitations, diarrhea, dysuria, hematuria, or hematochezia.  Has taken his rx pain medications without any resolution of sx.  Patient is a 42 y.o. male presenting with abdominal pain. The history is provided by the patient.  Abdominal Pain   Past Medical History  Diagnosis Date  . Cystic fibrosis   . Pancreatic cystic fibrosis   . Sinusitis     Past Surgical History  Procedure Laterality Date  . Frontal sinusotomy    . Cholecystectomy      Family History  Problem Relation Age of Onset  . Cancer Mother   . Hyperlipidemia Father   . Hypertension Father     History  Substance Use Topics  . Smoking status: Never Smoker   . Smokeless tobacco: Never Used  . Alcohol Use: No      Review of Systems  Gastrointestinal: Positive for abdominal pain.  All other systems reviewed and are negative.    Allergies  Septra  Home Medications   Current Outpatient Rx  Name  Route  Sig  Dispense  Refill  . albuterol (PROAIR HFA) 108 (90 BASE) MCG/ACT inhaler   Inhalation   Inhale 2 puffs into the lungs 2 (two) times daily as needed. For shortness of breath          . cetirizine (ZYRTEC) 10 MG  tablet   Oral   Take 10 mg by mouth daily.           . fluticasone (FLONASE) 50 MCG/ACT nasal spray   Nasal   Place 2 sprays into the nose daily.          . Fluticasone-Salmeterol (ADVAIR DISKUS) 500-50 MCG/DOSE AEPB   Inhalation   Inhale 1 puff into the lungs every 12 (twelve) hours.           . hydrocodone-acetaminophen (LORCET-HD) 5-500 MG per capsule   Oral   Take 1 capsule by mouth 2 (two) times daily as needed. For pain           . ibuprofen (ADVIL,MOTRIN) 200 MG tablet   Oral   Take 400 mg by mouth once as needed. For pain          . ipratropium (ATROVENT) 0.06 % nasal spray   Nasal   Place 2 sprays into the nose 4 (four) times daily.   15 mL   1   . lactobacillus acidophilus (BACID) TABS   Oral   Take 2 tablets by mouth 2 (two) times daily.         Marland Kitchen omeprazole (PRILOSEC) 10 MG capsule   Oral   Take 10 mg by mouth daily.          . ondansetron (ZOFRAN) 4 MG tablet   Oral   Take  1 tablet (4 mg total) by mouth every 6 (six) hours. As needed for n/v   12 tablet   0   . oxyCODONE (ROXICODONE) 15 MG immediate release tablet   Oral   Take 1 tablet (15 mg total) by mouth every 4 (four) hours as needed.   30 tablet   0   . oxyCODONE-acetaminophen (PERCOCET/ROXICET) 5-325 MG per tablet   Oral   Take 1 tablet by mouth every 6 (six) hours as needed for pain.   10 tablet   0     BP 148/78  Pulse 95  Temp(Src) 98.3 F (36.8 C) (Oral)  Resp 22  SpO2 100%  Physical Exam  Nursing note and vitals reviewed. Constitutional: He is oriented to person, place, and time. He appears well-developed and well-nourished.  HENT:  Head: Normocephalic and atraumatic.  Mouth/Throat: Oropharynx is clear and moist.  Eyes: Conjunctivae and EOM are normal. Pupils are equal, round, and reactive to light.  Neck: Normal range of motion. Neck supple.  Cardiovascular: Normal rate, regular rhythm and normal heart sounds.   Pulmonary/Chest: Effort normal and breath  sounds normal.  Abdominal: Soft. Bowel sounds are normal. There is tenderness in the epigastric area. There is no CVA tenderness, no tenderness at McBurney's point and negative Murphy's sign.  Musculoskeletal: Normal range of motion.  Neurological: He is alert and oriented to person, place, and time. He has normal strength. No cranial nerve deficit.  Skin: Skin is warm and dry.  Psychiatric: He has a normal mood and affect.    ED Course  Procedures (including critical care time)  Labs Reviewed  CBC WITH DIFFERENTIAL - Abnormal; Notable for the following:    WBC 21.7 (*)    Platelets 543 (*)    Neutrophils Relative 79 (*)    Neutro Abs 17.2 (*)    Lymphocytes Relative 10 (*)    Monocytes Absolute 2.4 (*)    All other components within normal limits  COMPREHENSIVE METABOLIC PANEL - Abnormal; Notable for the following:    Glucose, Bld 129 (*)    All other components within normal limits  LIPASE, BLOOD - Abnormal; Notable for the following:    Lipase >3000 (*)    All other components within normal limits  URINALYSIS, ROUTINE W REFLEX MICROSCOPIC   Ct Abdomen Pelvis W Contrast  10/27/2012  *RADIOLOGY REPORT*  Clinical Data: Severe abdominal pain.  Nausea.  CT ABDOMEN AND PELVIS WITH CONTRAST  Technique:  Multidetector CT imaging of the abdomen and pelvis was performed following the standard protocol during bolus administration of intravenous contrast.  Contrast: OMNIPAQUE IOHEXOL 300 MG/ML  SOLN  Comparison: MRCP 02/03/2012.  Findings: Lung bases show no acute findings.  Probable mucoid impaction in the right middle lobe, peripherally.  Heart size normal.  No pericardial effusion.  Mild intrahepatic biliary duct dilatation after cholecystectomy. Liver is otherwise unremarkable.  Adrenal glands are unremarkable. Sub centimeter low attenuation lesions in the right kidney, too small to characterize.  Kidneys and spleen are otherwise unremarkable.  There is mild heterogeneity in the head of  the pancreas with more focal area of low attenuation in the uncinate process on portal venous phase imaging.  On delayed renal phase imaging, and in comparison with the examination of 02/03/2012, this area of low attenuation likely represents a dilated and tortuous pancreatic duct. The entire pancreatic duct is dilated, as on 02/03/2012. Mild peripancreatic inflammatory stranding.  Stomach is unremarkable.  The duodenal wall appears thickened. Remainder of the  small bowel is unremarkable.  There is mesenteric haziness and nodularity, measuring up to 8 mm.  Colon is unremarkable.  No pathologically enlarged lymph nodes. Gastrohepatic ligament lymph nodes are not enlarged by CT size criteria.  No free fluid.  No worrisome lytic or sclerotic lesions.  IMPRESSION:  1.  Haziness surrounding the pancreas is indicative of acute pancreatitis. 2.  Pancreatic ductal dilatation, as before, with tortuosity and prominence in the uncinate process region.  Difficult to definitively exclude a true lesion early necrosis in this area. 3.  Duodenal thickening is likely secondary to pancreatic inflammation. 4.  Mild small bowel mesenteric haziness and sub centimeter nodularity, nonspecific.   Original Report Authenticated By: Leanna Battles, M.D.      1. Pancreatitis   2. Acute pancreatitis   3. Cystic fibrosis   4. Leukocytosis       MDM   42 y.ol male presenting to the ED for acute onset of epigastric pain.  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  Labs consistent with pancreatitis- LDH 273, lipase 3000+, WBC 21.7.  CT revealed abnormality of pancreatic ducts which was already known, however could not definitively exclude necrotic lesion of duct.  VS stable, pt afebrile.  Pt made NPO, given IVF, pain meds, and anti-emetics.  Hospitalist consulted- Dr. Elisabeth Pigeon.  Pt will be [facility] to [facility], med/surg.        Garlon Hatchet, PA-C 10/27/12 1615

## 2012-10-28 ENCOUNTER — Inpatient Hospital Stay (HOSPITAL_COMMUNITY): Payer: Commercial Managed Care - PPO

## 2012-10-28 DIAGNOSIS — J329 Chronic sinusitis, unspecified: Secondary | ICD-10-CM

## 2012-10-28 DIAGNOSIS — J309 Allergic rhinitis, unspecified: Secondary | ICD-10-CM

## 2012-10-28 LAB — COMPREHENSIVE METABOLIC PANEL
ALT: 11 U/L (ref 0–53)
BUN: 16 mg/dL (ref 6–23)
CO2: 29 mEq/L (ref 19–32)
Calcium: 7.5 mg/dL — ABNORMAL LOW (ref 8.4–10.5)
Creatinine, Ser: 0.8 mg/dL (ref 0.50–1.35)
GFR calc Af Amer: >90 mL/min (ref >90)
GFR calc non Af Amer: >90 mL/min (ref >90)
Glucose, Bld: 101 mg/dL — ABNORMAL HIGH (ref 70–99)
Sodium: 137 mEq/L (ref 135–145)

## 2012-10-28 LAB — CBC
Hemoglobin: 11.9 g/dL — ABNORMAL LOW (ref 13.0–17.0)
MCHC: 33.7 g/dL (ref 30.0–36.0)

## 2012-10-28 LAB — LIPASE, BLOOD: Lipase: 302 U/L — ABNORMAL HIGH (ref 11–59)

## 2012-10-28 MED ORDER — PANTOPRAZOLE SODIUM 40 MG IV SOLR
40.0000 mg | Freq: Every day | INTRAVENOUS | Status: DC
  Administered 2012-10-28 – 2012-10-30 (×3): 40 mg via INTRAVENOUS
  Filled 2012-10-28 (×3): qty 40

## 2012-10-28 NOTE — Progress Notes (Signed)
[facility] PROGRESS NOTE  Bryan Alvarado ZOX:[id number] DOB: [birth year] DOA: 10/27/2012 PCP: Theda Belfast, MD  Brief narrative: 42 year old male with past medical history of cystic fibrosis and pancreatitis related to this diagnosis who presented to [facility] ED 10/27/12 with worsening periumbilical pain for past few days prior to this admission. Patient also reported associated nausea and vomiting and poor oral intake.  In ED, evaluation included CT abdomen which was concerning for acute pancreatitis. Lipase level was above 3,000 and WBC count was elevated at 21.7.Liver function tests were all WNL.  Assessment and Plan:   Principal Problem:  Acute pancreatitis  In the setting of long standing history of cystic fibrosis. Continue NPO. Continue IV fluids, antiemetics and pain management with dilaudid 2 mg IV Q 2 hours PRN severe pain  Lipase level on admission >3,000. Trended down to 302 UDS positive for opiates and ethanol level WNL  Active Problems:  Leukocytosis  Secondary to acute pancreatitis Trending down from 21.7 to 17.8 Recent history of pneumonia  Continue Doxycycline and Levaquin   Code Status: Full  Family Communication: no family at bedside  Disposition Plan: home when stable   Manson Passey, MD  [facility] Pager [phone]  If 7PM-7AM, please contact night-coverage [email or web address] Password [email or web address] 10/28/2012, 4:08 PM   LOS: 1 day   Consultants:  None   Procedures:  None   Antibiotics:  None   HPI/Subjective: No acute overnight events.  Objective: Filed Vitals:   10/27/12 2204 10/28/12 0604 10/28/12 0843 10/28/12 1323  BP: 107/55 119/70  109/58  Pulse: 60 82  83  Temp: 97.4 F (36.3 C) 98.2 F (36.8 C)  97.6 F (36.4 C)  TempSrc: Oral Oral  Oral  Resp: 18 20    Weight: 69.2 kg (152 lb 8.9 oz)     SpO2: 95% 95% 95% 97%    Intake/Output Summary (Last 24 hours) at 10/28/12 1608 Last data filed at 10/28/12 1325  Gross per 24 hour  Intake 838.75  ml  Output   1425 ml  Net -586.25 ml    Exam:   General:  Pt is alert, follows commands appropriately, not in acute distress  Cardiovascular: Regular rate and rhythm, S1/S2, no murmurs, no rubs, no gallops  Respiratory: Clear to auscultation bilaterally, no wheezing, no crackles, no rhonchi  Abdomen: Soft, non tender, non distended, bowel sounds present, no guarding  Extremities: No edema, pulses DP and PT palpable bilaterally  Neuro: Grossly nonfocal  Data Reviewed: Basic Metabolic Panel:  Recent Labs Lab 10/27/12 1140 10/27/12 1714 10/28/12 0348  NA 136 137 137  K 3.6 3.2* 3.6  CL 100 103 104  CO2 20 27 29   GLUCOSE 129* 99 101*  BUN 17 15 16   CREATININE 0.78 0.79 0.80  CALCIUM 9.3 7.4* 7.5*  MG  --  1.8  --   PHOS  --  3.3  --    Liver Function Tests:  Recent Labs Lab 10/27/12 1140 10/27/12 1714 10/28/12 0348  AST 21 12 11   ALT 17 12 11   ALKPHOS 75 58 59  BILITOT 0.5 0.4 0.4  PROT 7.4 5.7* 5.6*  ALBUMIN 3.7 2.9* 2.9*    Recent Labs Lab 10/27/12 1140 10/28/12 0348  LIPASE >3000* 302*   No results found for this basename: AMMONIA,  in the last 168 hours CBC:  Recent Labs Lab 10/27/12 1140 10/27/12 1714 10/28/12 0348  WBC 21.7* 23.5* 17.8*  NEUTROABS 17.2* 20.2*  --   HGB 13.4 12.0* 11.9*  HCT  39.7 35.3* 35.3*  MCV 78.6 81.0 81.1  PLT 543* 327 305   Cardiac Enzymes: No results found for this basename: CKTOTAL, CKMB, CKMBINDEX, TROPONINI,  in the last 168 hours BNP: No components found with this basename: POCBNP,  CBG:  Recent Labs Lab 10/28/12 0742  GLUCAP 95    No results found for this or any previous visit (from the past 240 hour(s)).   Studies: Ct Abdomen Pelvis W Contrast 10/27/2012  * IMPRESSION:  1.  Haziness surrounding the pancreas is indicative of acute pancreatitis. 2.  Pancreatic ductal dilatation, as before, with tortuosity and prominence in the uncinate process region.  Difficult to definitively exclude a true  lesion early necrosis in this area. 3.  Duodenal thickening is likely secondary to pancreatic inflammation. 4.  Mild small bowel mesenteric haziness and sub centimeter nodularity, nonspecific.   Original Report Authenticated By: Leanna Battles, M.D.     Scheduled Meds: . doxycycline  100 mg Oral BID  . fluticasone  2 spray Each Nare Daily  . ipratropium  4 spray Nasal QID  . lactobacillus acidophilus  2 tablet Oral BID  . levofloxacin  750 mg Oral Daily  . loratadine  10 mg Oral Daily  . mometasone-formoterol  2 puff Inhalation BID  .  (PROTONIX) IV  40 mg Intravenous Daily  . predniSONE  10 mg Oral BID AC   Followed by  .  predniSONE  10 mg Oral Q breakfast  . zolpidem  5 mg Oral QHS

## 2012-10-28 NOTE — ED Provider Notes (Signed)
Shared service with midlevel provider. I have personally seen and examined the patient, providing direct face to face care, presenting with the chief complaint of abd pain. Physical exam findings include epigastric tenderness, CT showed pancreatitis with no specific necrotic lesion. Plan will be to admit to medicine. I have reviewed the nursing documentation on past medical history, family history, and social history.  Derwood Kaplan, MD 10/28/12 [phone]

## 2012-10-29 LAB — CBC
Hemoglobin: 12.6 g/dL — ABNORMAL LOW (ref 13.0–17.0)
MCH: 27.7 pg (ref 26.0–34.0)
MCV: 82.2 fL (ref 78.0–100.0)
RBC: 4.55 MIL/uL (ref 4.22–5.81)

## 2012-10-29 LAB — BASIC METABOLIC PANEL
BUN: 15 mg/dL (ref 6–23)
CO2: 26 mEq/L (ref 19–32)
Calcium: 8.3 mg/dL — ABNORMAL LOW (ref 8.4–10.5)
Creatinine, Ser: 0.78 mg/dL (ref 0.50–1.35)
Glucose, Bld: 81 mg/dL (ref 70–99)

## 2012-10-29 LAB — GLUCOSE, CAPILLARY: Glucose-Capillary: 75 mg/dL (ref 70–99)

## 2012-10-29 MED ORDER — CALCIUM CARBONATE ANTACID 500 MG PO CHEW
1.0000 | CHEWABLE_TABLET | Freq: Once | ORAL | Status: AC
  Administered 2012-10-29: 200 mg via ORAL
  Filled 2012-10-29: qty 1

## 2012-10-29 MED ORDER — ALUM & MAG HYDROXIDE-SIMETH 200-200-20 MG/5ML PO SUSP
30.0000 mL | Freq: Four times a day (QID) | ORAL | Status: DC | PRN
  Administered 2012-10-29: 30 mL via ORAL
  Filled 2012-10-29: qty 30

## 2012-10-29 NOTE — Progress Notes (Signed)
[facility] PROGRESS NOTE  Caellum Mancil MVH:[id number] DOB: 10/03/1970 DOA: 10/27/2012 PCP: Theda Belfast, MD  Brief narrative: Ermon Sagan is an 42 y.o. male with a PMH of cystic fibrosis and pancreatitis related to this diagnosis who presented to [facility] ED 10/27/12 with recurrent pancreatitis. Lipase level was above 3,000 and WBC count was elevated at 21.7. Liver function tests were all WNL.   Assessment/Plan: Principal Problem:  Acute pancreatitis  -In the setting of long standing history of cystic fibrosis. Start sips of CL.  -Continue IV fluids, antiemetics and pain management with dilaudid 2 mg IV Q 2 hours PRN severe pain. -Lipase level on admission >3,000--->302--->221.  Active Problems:  Leukocytosis  -Secondary to acute pancreatitis (inflammatory marker) and possible recent PNA.  -Chest x-ray done on 10/28/2012 showed some bronchitic changes but no infiltrates. Recent history of pneumonia  -Continue Doxycycline and Levaquin.  Code Status: Full  Family Communication: No family at bedside.  Disposition Plan: Home when stable.   Medical Consultants:  None.  Other Consultants:  None.  Anti-infectives:  Levaquin 10/27/2012--->  Doxycycline 10/27/2012--->  HPI/Subjective: Aleksa Napoli continues to have some abdominal pain and discomfort. He says this episode of pancreatitis has lasted longer than his typical episodes. He tells me he is planning to followup with physicians at [facility] for consideration of a stent. No diarrhea or vomiting.  Objective: Filed Vitals:   10/28/12 1921 10/28/12 2100 10/29/12 0537 10/29/12 0741  BP:  120/65 107/56   Pulse: 82 83 89   Temp:  98.7 F (37.1 C) 98.3 F (36.8 C)   TempSrc:  Oral Oral   Resp: 20 18 16    Weight:   70.1 kg (154 lb 8.7 oz)   SpO2: 97% 94% 98% 92%    Intake/Output Summary (Last 24 hours) at 10/29/12 1059 Last data filed at 10/28/12 1500  Gross per 24 hour  Intake    600 ml  Output    600 ml   Net      0 ml    Exam: Gen:  NAD Cardiovascular:  RRR, No M/R/G Respiratory:  Lungs CTAB Gastrointestinal:  Abdomen soft, NT/ND, + BS Extremities:  No C/E/C  Data Reviewed: Basic Metabolic Panel:  Recent Labs Lab 10/27/12 1140 10/27/12 1714 10/28/12 0348 10/29/12 0415  NA 136 137 137 136  K 3.6 3.2* 3.6 3.9  CL 100 103 104 101  CO2 20 27 29 26   GLUCOSE 129* 99 101* 81  BUN 17 15 16 15   CREATININE 0.78 0.79 0.80 0.78  CALCIUM 9.3 7.4* 7.5* 8.3*  MG  --  1.8  --   --   PHOS  --  3.3  --   --    GFR The CrCl is unknown because both a height and weight (above a minimum accepted value) are required for this calculation. Liver Function Tests:  Recent Labs Lab 10/27/12 1140 10/27/12 1714 10/28/12 0348  AST 21 12 11   ALT 17 12 11   ALKPHOS 75 58 59  BILITOT 0.5 0.4 0.4  PROT 7.4 5.7* 5.6*  ALBUMIN 3.7 2.9* 2.9*    Recent Labs Lab 10/27/12 1140 10/28/12 0348 10/29/12 0415  LIPASE >3000* 302* 221*   Coagulation profile  Recent Labs Lab 10/27/12 1714  INR 1.19    CBC:  Recent Labs Lab 10/27/12 1140 10/27/12 1714 10/28/12 0348 10/29/12 0415  WBC 21.7* 23.5* 17.8* 25.2*  NEUTROABS 17.2* 20.2*  --   --   HGB 13.4 12.0* 11.9* 12.6*  HCT 39.7 35.3*  35.3* 37.4*  MCV 78.6 81.0 81.1 82.2  PLT 543* 327 305 343   CBG:  Recent Labs Lab 10/28/12 0742 10/29/12 0758  GLUCAP 95 75   Procedures and Diagnostic Studies:  Ct Abdomen Pelvis W Contrast 10/27/2012 IMPRESSION:  1.  Haziness surrounding the pancreas is indicative of acute pancreatitis. 2.  Pancreatic ductal dilatation, as before, with tortuosity and prominence in the uncinate process region.  Difficult to definitively exclude a true lesion early necrosis in this area. 3.  Duodenal thickening is likely secondary to pancreatic inflammation. 4.  Mild small bowel mesenteric haziness and sub centimeter nodularity, nonspecific.   Original Report Authenticated By: Leanna Battles, M.D.     Dg Chest Port  1 View 10/28/2012  IMPRESSION: Mild bronchitic type lung changes but no infiltrates or effusions.   Original Report Authenticated By: Rudie Meyer, M.D.     Scheduled Meds: . doxycycline  100 mg Oral BID  . fluticasone  2 spray Each Nare Daily  . ipratropium  4 spray Nasal QID  . lactobacillus acidophilus  2 tablet Oral BID  . levofloxacin  750 mg Oral Daily  . loratadine  10 mg Oral Daily  . mometasone-formoterol  2 puff Inhalation BID  . pantoprazole (PROTONIX) IV  40 mg Intravenous Daily  . predniSONE  10 mg Oral Q breakfast  . zolpidem  5 mg Oral QHS   Continuous Infusions: . sodium chloride 75 mL/hr at 10/28/12 1500    Time spent: 25 minutes.   LOS: 2 days   Chipper Koudelka  [facility] Pager [phone].  If 8PM-8AM, please contact night-coverage at [email or web address], password [facility] 10/29/2012, 10:59 AM

## 2012-10-30 LAB — CBC
HCT: 34.7 % — ABNORMAL LOW (ref 39.0–52.0)
Hemoglobin: 11.5 g/dL — ABNORMAL LOW (ref 13.0–17.0)
MCH: 27.1 pg (ref 26.0–34.0)
MCV: 81.6 fL (ref 78.0–100.0)
RBC: 4.25 MIL/uL (ref 4.22–5.81)

## 2012-10-30 LAB — GLUCOSE, CAPILLARY: Glucose-Capillary: 86 mg/dL (ref 70–99)

## 2012-10-30 MED ORDER — PANTOPRAZOLE SODIUM 40 MG PO TBEC
40.0000 mg | DELAYED_RELEASE_TABLET | Freq: Every day | ORAL | Status: DC
  Administered 2012-10-31: 40 mg via ORAL
  Filled 2012-10-30: qty 1

## 2012-10-30 MED ORDER — SALINE SPRAY 0.65 % NA SOLN
1.0000 | NASAL | Status: DC | PRN
  Filled 2012-10-30: qty 44

## 2012-10-30 MED ORDER — ENSURE COMPLETE PO LIQD
237.0000 mL | Freq: Two times a day (BID) | ORAL | Status: DC
  Administered 2012-10-30 – 2012-10-31 (×2): 237 mL via ORAL

## 2012-10-30 MED ORDER — CALCIUM CARBONATE ANTACID 500 MG PO CHEW
200.0000 mg | CHEWABLE_TABLET | ORAL | Status: DC | PRN
  Filled 2012-10-30: qty 1

## 2012-10-30 MED ORDER — LACTINEX PO CHEW
2.0000 | CHEWABLE_TABLET | Freq: Two times a day (BID) | ORAL | Status: DC
  Administered 2012-10-30 – 2012-10-31 (×3): 2 via ORAL
  Filled 2012-10-30 (×5): qty 2

## 2012-10-30 NOTE — Progress Notes (Signed)
Pharmacy: IV to PO Protonix  Patient has been receiving IV Protonix. Per Pharmacy and Therapeutics Committee, this patient meets criteria for auto conversion to PO Protonix:   Tolerating a diet of full liquids or better  Tolerating other medications via enteral route  No GIB  Lucious Zou PharmD  [phone] 09/01/2011 8:54 AM    

## 2012-10-30 NOTE — Progress Notes (Signed)
[facility] PROGRESS NOTE  Truxton Stupka ZOX:[id number] DOB: 04-Dec-1970 DOA: 10/27/2012 PCP: Theda Belfast, MD  Brief narrative: Bryan Alvarado is an 42 y.o. male with a PMH of cystic fibrosis and pancreatitis related to this diagnosis who presented to [facility] ED 10/27/12 with recurrent pancreatitis. Lipase level was above 3,000 and WBC count was elevated at 21.7. Liver function tests were all WNL.   Assessment/Plan: Principal Problem:  Acute pancreatitis  -In the setting of long standing history of cystic fibrosis. Tolerating clears, advanced to full and then advanced further as tolerated.  -Continue IV fluids, antiemetics and pain management with dilaudid 2 mg IV Q 2 hours PRN severe pain. -Lipase level on admission >3,000--->302--->221--->45  Active Problems:  Leukocytosis  -Secondary to acute pancreatitis (inflammatory marker) and possible recent PNA.  -Chest x-ray done on 10/28/2012 showed some bronchitic changes but no infiltrates. Recent history of pneumonia  -Continue Doxycycline and Levaquin.  Code Status: Full  Family Communication: No family at bedside.  Disposition Plan: Home when stable.   Medical Consultants:  None.  Other Consultants:  None.  Anti-infectives:  Levaquin 10/27/2012--->  Doxycycline 10/27/2012--->  HPI/Subjective: Bryan Alvarado is beginning to think that his abdominal pain is improving. He wants to try to advance his diet to full liquids today. He is having some nasal congestion and postnasal drip with some chest tightness, dry cough. No dyspnea.  Objective: Filed Vitals:   10/29/12 2111 10/29/12 2127 10/30/12 0537 10/30/12 0800  BP:  110/62 101/55   Pulse:  75 74   Temp:  97.9 F (36.6 C) 98.3 F (36.8 C)   TempSrc:  Axillary Oral   Resp:  16 16   Height:      Weight:      SpO2: 98% 97% 95% 98%    Intake/Output Summary (Last 24 hours) at 10/30/12 1137 Last data filed at 10/30/12 0900  Gross per 24 hour  Intake    3612 ml  Output   2125 ml  Net   1487 ml    Exam: Gen:  NAD Cardiovascular:  RRR, No M/R/G Respiratory:  Lungs CTAB Gastrointestinal:  Abdomen soft, NT/ND, + BS Extremities:  No C/E/C  Data Reviewed: Basic Metabolic Panel:  Recent Labs Lab 10/27/12 1140 10/27/12 1714 10/28/12 0348 10/29/12 0415  NA 136 137 137 136  K 3.6 3.2* 3.6 3.9  CL 100 103 104 101  CO2 20 27 29 26   GLUCOSE 129* 99 101* 81  BUN 17 15 16 15   CREATININE 0.78 0.79 0.80 0.78  CALCIUM 9.3 7.4* 7.5* 8.3*  MG  --  1.8  --   --   PHOS  --  3.3  --   --    GFR Estimated Creatinine Clearance: 117.6 ml/min (by C-G formula based on Cr of 0.78). Liver Function Tests:  Recent Labs Lab 10/27/12 1140 10/27/12 1714 10/28/12 0348  AST 21 12 11   ALT 17 12 11   ALKPHOS 75 58 59  BILITOT 0.5 0.4 0.4  PROT 7.4 5.7* 5.6*  ALBUMIN 3.7 2.9* 2.9*    Recent Labs Lab 10/27/12 1140 10/28/12 0348 10/29/12 0415 10/30/12 0424  LIPASE >3000* 302* 221* 45   Coagulation profile  Recent Labs Lab 10/27/12 1714  INR 1.19    CBC:  Recent Labs Lab 10/27/12 1140 10/27/12 1714 10/28/12 0348 10/29/12 0415 10/30/12 0424  WBC 21.7* 23.5* 17.8* 25.2* 21.1*  NEUTROABS 17.2* 20.2*  --   --   --   HGB 13.4 12.0* 11.9* 12.6* 11.5*  HCT 39.7 35.3* 35.3* 37.4* 34.7*  MCV 78.6 81.0 81.1 82.2 81.6  PLT 543* 327 305 343 338   CBG:  Recent Labs Lab 10/28/12 0742 10/29/12 0758  GLUCAP 95 75   Procedures and Diagnostic Studies:  Ct Abdomen Pelvis W Contrast 10/27/2012 IMPRESSION:  1.  Haziness surrounding the pancreas is indicative of acute pancreatitis. 2.  Pancreatic ductal dilatation, as before, with tortuosity and prominence in the uncinate process region.  Difficult to definitively exclude a true lesion early necrosis in this area. 3.  Duodenal thickening is likely secondary to pancreatic inflammation. 4.  Mild small bowel mesenteric haziness and sub centimeter nodularity, nonspecific.   Original Report  Authenticated By: Leanna Battles, M.D.     Dg Chest Port 1 View 10/28/2012  IMPRESSION: Mild bronchitic type lung changes but no infiltrates or effusions.   Original Report Authenticated By: Rudie Meyer, M.D.     Scheduled Meds: . doxycycline  100 mg Oral BID  . fluticasone  2 spray Each Nare Daily  . lactobacillus acidophilus & bulgar  2 tablet Oral BID WC  . levofloxacin  750 mg Oral Daily  . loratadine  10 mg Oral Daily  . mometasone-formoterol  2 puff Inhalation BID  . pantoprazole (PROTONIX) IV  40 mg Intravenous Daily  . zolpidem  5 mg Oral QHS   Continuous Infusions: . sodium chloride 75 mL/hr at 10/29/12 2215    Time spent: 25 minutes.   LOS: 3 days   Emmajean Ratledge  [facility] Pager [phone].  If 8PM-8AM, please contact night-coverage at [email or web address], password [facility] 10/30/2012, 11:37 AM

## 2012-10-31 MED ORDER — ONDANSETRON HCL 4 MG PO TABS: 4.0000 mg | ORAL_TABLET | Freq: Four times a day (QID) | ORAL | Status: DC | PRN

## 2012-10-31 MED ORDER — OXYCODONE-ACETAMINOPHEN 5-325 MG PO TABS: 1.0000 | ORAL_TABLET | ORAL | Status: DC | PRN

## 2012-10-31 MED ORDER — HYDROMORPHONE HCL PF 2 MG/ML IJ SOLN
2.0000 mg | INTRAMUSCULAR | Status: DC | PRN
  Administered 2012-10-31 (×2): 2 mg via INTRAVENOUS
  Filled 2012-10-31: qty 2

## 2012-10-31 NOTE — Discharge Summary (Signed)
Physician Discharge Summary  Bryan Alvarado ZOX:[id number] DOB: 1970/09/26 DOA: 10/27/2012  PCP: Theda Belfast, MD  Admit date: 10/27/2012 Discharge date: 10/31/2012  Recommendations for Outpatient Follow-up:  1. Followup with Dr. Lurena Nida, pulmonologist, for evaluation of chest tightness post discharge. Call 11/01/2012 appointment. 2. Followup with Dr. Elnoria Howard in 1-2 weeks.  Discharge Diagnoses:  Principal Problem:    Acute pancreatitis Active Problems:    Cystic fibrosis    Leukocytosis    Recent history of pneumonia   Discharge Condition: Improved.  Diet recommendation: Low fat.  History of present illness:  Bryan Alvarado is an 42 y.o. male with a PMH of cystic fibrosis and pancreatitis related to this diagnosis who presented to [facility] ED 10/27/12 with recurrent pancreatitis. Lipase level was above 3,000 and WBC count was elevated at 21.7. Liver function tests were all WNL.  Hospital Course by problem:  Principal Problem:  Acute pancreatitis  -In the setting of long standing history of cystic fibrosis. Tolerating clears, advanced to full and then advanced to bland on the morning of discharge. Tolerated advancement of diet without return of significant symptoms. -Treated with IV fluids, antiemetics and pain management with dilaudid 2 mg IV Q 2 hours PRN severe pain.  -Discharge home on Percocet as needed for breakthrough pain. -Lipase level on admission >3,000--->302--->221--->45. Active Problems:  Leukocytosis  -Secondary to acute pancreatitis (inflammatory marker) and possible recent PNA.  -Chest x-ray done on 10/28/2012 showed some bronchitic changes but no infiltrates.  Recent history of pneumonia  -Continue Doxycycline and Levaquin. Completed a course of prednisone. -Recommend close followup with pulmonologist.  Procedures:  None.  Consultations:  None.  Discharge Exam: Filed Vitals:   10/31/12 0500  BP:   Pulse:   Temp: 97.2 F (36.2 C)  Resp:     Filed Vitals:   10/30/12 2141 10/31/12 0443 10/31/12 0500 10/31/12 0853  BP: 119/71 120/65    Pulse: 76 71    Temp: 97.8 F (36.6 C) 97.5 F (36.4 C) 97.2 F (36.2 C)   TempSrc: Oral Oral    Resp: 16 18    Height:      Weight:  67.4 kg (148 lb 9.4 oz) 67.4 kg (148 lb 9.4 oz)   SpO2: 99% 97%  95%    Gen:  NAD Cardiovascular:  RRR, No M/R/G Respiratory: Lungs CTAB Gastrointestinal: Abdomen soft, NT/ND with normal active bowel sounds. Extremities: No C/E/C   Discharge Instructions  Discharge Orders   Future Orders Complete By Expires     Call MD for:  persistant nausea and vomiting  As directed     Call MD for:  severe uncontrolled pain  As directed     Call MD for:  temperature >100.4  As directed     Diet general  As directed     Scheduling Instructions:      Low fat.    Discharge instructions  As directed     Comments:      Call Dr. Lurena Nida at [facility] on Monday to have him assess your lungs and determine if you need different antibiotics.  May return to work when cleared by Dr. Lurena Nida.  Note: Hospitalized 10/27/12-10/31/12.    Increase activity slowly  As directed         Medication List    STOP taking these medications       hydrocodone-acetaminophen 5-500 MG per capsule  Commonly known as:  LORCET-HD     predniSONE 10 MG tablet  Commonly known as:  STERAPRED UNI-PAK  TAKE these medications       ADVAIR DISKUS 500-50 MCG/DOSE Aepb  Generic drug:  Fluticasone-Salmeterol  Inhale 1 puff into the lungs every 12 (twelve) hours.     cholecalciferol 1000 UNITS tablet  Commonly known as:  VITAMIN D  Take 2,000 Units by mouth daily.     doxycycline 100 MG tablet  Commonly known as:  VIBRA-TABS  Take 100 mg by mouth 2 (two) times daily. For 2 weeks.     fluticasone 50 MCG/ACT nasal spray  Commonly known as:  FLONASE  Place 2 sprays into the nose daily.     ibuprofen 200 MG tablet  Commonly known as:  ADVIL,MOTRIN  Take 400 mg by mouth once as  needed. For pain     ipratropium 0.06 % nasal spray  Commonly known as:  ATROVENT  Place 2 sprays into the nose 4 (four) times daily.     lactobacillus acidophilus Tabs  Take 2 tablets by mouth daily.     levofloxacin 750 MG tablet  Commonly known as:  LEVAQUIN  Take 750 mg by mouth daily. For 2 weeks.     omeprazole 10 MG capsule  Commonly known as:  PRILOSEC  Take 10 mg by mouth daily.     ondansetron 4 MG tablet  Commonly known as:  ZOFRAN  Take 1 tablet (4 mg total) by mouth every 6 (six) hours as needed for nausea.     oxyCODONE-acetaminophen 5-325 MG per tablet  Commonly known as:  ROXICET  Take 1 tablet by mouth every 4 (four) hours as needed for pain.     PROAIR HFA 108 (90 BASE) MCG/ACT inhaler  Generic drug:  albuterol  Inhale 2 puffs into the lungs 2 (two) times daily as needed. For shortness of breath           Follow-up Information   Follow up with Dr. Lurena Nida. Call in 1 day. (For evaluation of lung symptoms.)    Contact information:   [facility]      Follow up with HUNG,PATRICK D, MD. Schedule an appointment as soon as possible for a visit in 2 weeks. [facility] follow up)    Contact information:   [address] [place] [address] [phone]        The results of significant diagnostics from this hospitalization (including imaging, microbiology, ancillary and laboratory) are listed below for reference.    Significant Diagnostic Studies: Ct Abdomen Pelvis W Contrast  10/27/2012  *RADIOLOGY REPORT*  Clinical Data: Severe abdominal pain.  Nausea.  CT ABDOMEN AND PELVIS WITH CONTRAST  Technique:  Multidetector CT imaging of the abdomen and pelvis was performed following the standard protocol during bolus administration of intravenous contrast.  Contrast: OMNIPAQUE IOHEXOL 300 MG/ML  SOLN  Comparison: MRCP 02/03/2012.  Findings: Lung bases show no acute findings.  Probable mucoid impaction in the right middle lobe, peripherally.  Heart  size normal.  No pericardial effusion.  Mild intrahepatic biliary duct dilatation after cholecystectomy. Liver is otherwise unremarkable.  Adrenal glands are unremarkable. Sub centimeter low attenuation lesions in the right kidney, too small to characterize.  Kidneys and spleen are otherwise unremarkable.  There is mild heterogeneity in the head of the pancreas with more focal area of low attenuation in the uncinate process on portal venous phase imaging.  On delayed renal phase imaging, and in comparison with the examination of 02/03/2012, this area of low attenuation likely represents a dilated and tortuous pancreatic duct. The entire pancreatic duct is  dilated, as on 02/03/2012. Mild peripancreatic inflammatory stranding.  Stomach is unremarkable.  The duodenal wall appears thickened. Remainder of the small bowel is unremarkable.  There is mesenteric haziness and nodularity, measuring up to 8 mm.  Colon is unremarkable.  No pathologically enlarged lymph nodes. Gastrohepatic ligament lymph nodes are not enlarged by CT size criteria.  No free fluid.  No worrisome lytic or sclerotic lesions.  IMPRESSION:  1.  Haziness surrounding the pancreas is indicative of acute pancreatitis. 2.  Pancreatic ductal dilatation, as before, with tortuosity and prominence in the uncinate process region.  Difficult to definitively exclude a true lesion early necrosis in this area. 3.  Duodenal thickening is likely secondary to pancreatic inflammation. 4.  Mild small bowel mesenteric haziness and sub centimeter nodularity, nonspecific.   Original Report Authenticated By: Leanna Battles, M.D.    Dg Chest Port 1 View  10/28/2012  *RADIOLOGY REPORT*  Clinical Data: Pancreatitis.  History of cystic fibrosis.  PORTABLE CHEST - 1 VIEW  Comparison: 03/16/2011.  Findings: The cardiac silhouette, mediastinal and hilar contours are normal.  There are mild chronic bronchitic type lung changes but no infiltrates, edema or effusions.  The bony  thorax is intact.  IMPRESSION: Mild bronchitic type lung changes but no infiltrates or effusions.   Original Report Authenticated By: Rudie Meyer, M.D.     Labs:  Basic Metabolic Panel:  Recent Labs Lab 10/27/12 1140 10/27/12 1714 10/28/12 0348 10/29/12 0415  NA 136 137 137 136  K 3.6 3.2* 3.6 3.9  CL 100 103 104 101  CO2 20 27 29 26   GLUCOSE 129* 99 101* 81  BUN 17 15 16 15   CREATININE 0.78 0.79 0.80 0.78  CALCIUM 9.3 7.4* 7.5* 8.3*  MG  --  1.8  --   --   PHOS  --  3.3  --   --    GFR Estimated Creatinine Clearance: 115.8 ml/min (by C-G formula based on Cr of 0.78). Liver Function Tests:  Recent Labs Lab 10/27/12 1140 10/27/12 1714 10/28/12 0348  AST 21 12 11   ALT 17 12 11   ALKPHOS 75 58 59  BILITOT 0.5 0.4 0.4  PROT 7.4 5.7* 5.6*  ALBUMIN 3.7 2.9* 2.9*    Recent Labs Lab 10/27/12 1140 10/28/12 0348 10/29/12 0415 10/30/12 0424  LIPASE >3000* 302* 221* 45   Coagulation profile  Recent Labs Lab 10/27/12 1714  INR 1.19    CBC:  Recent Labs Lab 10/27/12 1140 10/27/12 1714 10/28/12 0348 10/29/12 0415 10/30/12 0424  WBC 21.7* 23.5* 17.8* 25.2* 21.1*  NEUTROABS 17.2* 20.2*  --   --   --   HGB 13.4 12.0* 11.9* 12.6* 11.5*  HCT 39.7 35.3* 35.3* 37.4* 34.7*  MCV 78.6 81.0 81.1 82.2 81.6  PLT 543* 327 305 343 338   CBG:  Recent Labs Lab 10/28/12 0742 10/29/12 0758 10/30/12 0756  GLUCAP 95 75 86    Time coordinating discharge: 35 minutes.  Signed:  Kevron Patella  Pager [phone] [facility] 10/31/2012, 10:15 AM

## 2012-10-31 NOTE — Progress Notes (Signed)
Pt. Ate 100 % of his bland diet, tolerated well. OOB to the bathroom. Rates pain in his mid Abd. A #3. States his abdomen starts hurting after he eats or moves around. Denies needing any pain medicine. Discharge instructions explained to pt and prescription given to pt. Discharged to wife.

## 2013-03-28 ENCOUNTER — Encounter (HOSPITAL_COMMUNITY): Payer: Self-pay | Admitting: Emergency Medicine

## 2013-03-28 ENCOUNTER — Observation Stay (HOSPITAL_COMMUNITY)
Admission: EM | Admit: 2013-03-28 | Discharge: 2013-03-29 | Disposition: A | Discharge status: Home / Self Care | Payer: Commercial Managed Care - PPO | Attending: Family Medicine | Admitting: Family Medicine

## 2013-03-28 DIAGNOSIS — D72829 Elevated white blood cell count, unspecified: Secondary | ICD-10-CM

## 2013-03-28 DIAGNOSIS — R748 Abnormal levels of other serum enzymes: Secondary | ICD-10-CM | POA: Insufficient documentation

## 2013-03-28 DIAGNOSIS — Z79899 Other long term (current) drug therapy: Secondary | ICD-10-CM | POA: Insufficient documentation

## 2013-03-28 DIAGNOSIS — Z9889 Other specified postprocedural states: Secondary | ICD-10-CM

## 2013-03-28 DIAGNOSIS — K831 Obstruction of bile duct: Secondary | ICD-10-CM | POA: Insufficient documentation

## 2013-03-28 DIAGNOSIS — J309 Allergic rhinitis, unspecified: Secondary | ICD-10-CM

## 2013-03-28 DIAGNOSIS — K861 Other chronic pancreatitis: Secondary | ICD-10-CM | POA: Insufficient documentation

## 2013-03-28 DIAGNOSIS — R109 Unspecified abdominal pain: Principal | ICD-10-CM | POA: Insufficient documentation

## 2013-03-28 DIAGNOSIS — K859 Acute pancreatitis without necrosis or infection, unspecified: Secondary | ICD-10-CM

## 2013-03-28 DIAGNOSIS — J329 Chronic sinusitis, unspecified: Secondary | ICD-10-CM

## 2013-03-28 DIAGNOSIS — G8918 Other acute postprocedural pain: Secondary | ICD-10-CM | POA: Insufficient documentation

## 2013-03-28 LAB — CBC WITH DIFFERENTIAL/PLATELET
Basophils Absolute: 0 10*3/uL (ref 0.0–0.1)
HCT: 38 % — ABNORMAL LOW (ref 39.0–52.0)
Lymphocytes Relative: 4 % — ABNORMAL LOW (ref 12–46)
Monocytes Absolute: 0.3 10*3/uL (ref 0.1–1.0)
Neutro Abs: 12.2 10*3/uL — ABNORMAL HIGH (ref 1.7–7.7)
Platelets: 330 10*3/uL (ref 150–400)
RDW: 13.4 % (ref 11.5–15.5)
WBC: 13 10*3/uL — ABNORMAL HIGH (ref 4.0–10.5)

## 2013-03-28 MED ORDER — ONDANSETRON 4 MG PO TBDP
4.0000 mg | ORAL_TABLET | Freq: Once | ORAL | Status: AC
  Administered 2013-03-28: 4 mg via ORAL
  Filled 2013-03-28: qty 1

## 2013-03-28 MED ORDER — MORPHINE SULFATE 4 MG/ML IJ SOLN
4.0000 mg | Freq: Once | INTRAMUSCULAR | Status: AC
  Administered 2013-03-28: 4 mg via INTRAVENOUS
  Filled 2013-03-28: qty 1

## 2013-03-28 MED ORDER — SODIUM CHLORIDE 0.9 % IV BOLUS (SEPSIS)
1000.0000 mL | Freq: Once | INTRAVENOUS | Status: AC
  Administered 2013-03-28: 1000 mL via INTRAVENOUS

## 2013-03-28 NOTE — ED Provider Notes (Signed)
CSN: [id number]     Arrival date & time 03/28/13  2203 History   First MD Initiated Contact with Patient 03/28/13 2254     Chief Complaint  Patient presents with  . Abdominal Pain   (Consider location/radiation/quality/duration/timing/severity/associated sxs/prior Treatment) HPI  Bryan Alvarado is a 42 y.o. male with past medical history significant for cystic fibrosis causing a chronic pancreatitis. Patient had ERCP with stent placement in the pancreatic ducts for stricure and stone removal at [facility] earlier in the day. Patient has had increasing pain, not relieved with 15 mg of Percocet and an hour later 20 mg. patient endorses nausea but denies vomiting, fever, change in bowel or bladder habits.  Past Medical History  Diagnosis Date  . Cystic fibrosis   . Pancreatic cystic fibrosis   . Sinusitis    Past Surgical History  Procedure Laterality Date  . Frontal sinusotomy    . Cholecystectomy    . Pancreatic stent     Family History  Problem Relation Age of Onset  . Cancer Mother   . Hyperlipidemia Father   . Hypertension Father    History  Substance Use Topics  . Smoking status: Never Smoker   . Smokeless tobacco: Never Used  . Alcohol Use: No    Review of Systems 10 systems reviewed and found to be negative, except as noted in the HPI   Allergies  Septra  Home Medications   Current Outpatient Rx  Name  Route  Sig  Dispense  Refill  . albuterol (PROAIR HFA) 108 (90 BASE) MCG/ACT inhaler   Inhalation   Inhale 2 puffs into the lungs 2 (two) times daily as needed for wheezing or shortness of breath.          . butorphanol (STADOL) 10 MG/ML nasal spray   Nasal   Place 1 spray into the nose every 4 (four) hours as needed for pain.         . fluticasone (FLONASE) 50 MCG/ACT nasal spray   Nasal   Place 2 sprays into the nose daily.          . Fluticasone-Salmeterol (ADVAIR DISKUS) 500-50 MCG/DOSE AEPB   Inhalation   Inhale 1 puff into the lungs every 12  (twelve) hours.           Marland Kitchen ibuprofen (ADVIL,MOTRIN) 200 MG tablet   Oral   Take 400 mg by mouth every 8 (eight) hours as needed for pain.          Marland Kitchen omeprazole (PRILOSEC) 20 MG capsule   Oral   Take 20 mg by mouth every morning.         Marland Kitchen oxyCODONE (ROXICODONE) 15 MG immediate release tablet   Oral   Take 15 mg by mouth once.         Marland Kitchen oxyCODONE-acetaminophen (PERCOCET) 10-325 MG per tablet   Oral   Take 1 tablet by mouth every 4 (four) hours as needed for pain.          BP 122/75  Pulse 62  Temp(Src) 98.3 F (36.8 C) (Oral)  Resp 18  Ht 5\' 8"  (1.727 m)  Wt 150 lb (68.04 kg)  BMI 22.81 kg/m2  SpO2 100% Physical Exam  Nursing note and vitals reviewed. Constitutional: He is oriented to person, place, and time. He appears well-developed and well-nourished. No distress.  HENT:  Head: Normocephalic.  Eyes: Conjunctivae and EOM are normal.  Cardiovascular: Normal rate.   Pulmonary/Chest: Effort normal and breath sounds normal. No stridor.  Abdominal: Soft. Bowel sounds are normal. He exhibits no distension and no mass. There is tenderness. There is no rebound and no guarding.  Diffusely tender to palpation with no guarding or rebound  Musculoskeletal: Normal range of motion.  Neurological: He is alert and oriented to person, place, and time.  Psychiatric: He has a normal mood and affect.    ED Course  Procedures (including critical care time) Labs Review Labs Reviewed  CBC WITH DIFFERENTIAL - Abnormal; Notable for the following:    WBC 13.0 (*)    HCT 38.0 (*)    MCV 77.9 (*)    Neutrophils Relative % 94 (*)    Neutro Abs 12.2 (*)    Lymphocytes Relative 4 (*)    Lymphs Abs 0.5 (*)    Monocytes Relative 2 (*)    All other components within normal limits  BASIC METABOLIC PANEL - Abnormal; Notable for the following:    Glucose, Bld 157 (*)    All other components within normal limits  HEPATIC FUNCTION PANEL - Abnormal; Notable for the following:    AST  166 (*)    ALT 96 (*)    Alkaline Phosphatase 129 (*)    All other components within normal limits  LIPASE, BLOOD - Abnormal; Notable for the following:    Lipase 66 (*)    All other components within normal limits  COMPREHENSIVE METABOLIC PANEL - Abnormal; Notable for the following:    Glucose, Bld 131 (*)    Calcium 8.3 (*)    Total Protein 5.8 (*)    Albumin 3.0 (*)    AST 269 (*)    ALT 185 (*)    Alkaline Phosphatase 131 (*)    Total Bilirubin 1.5 (*)    All other components within normal limits  GLUCOSE, CAPILLARY - Abnormal; Notable for the following:    Glucose-Capillary 106 (*)    All other components within normal limits  LIPASE, BLOOD   Imaging Review No results found.  MDM   1. S/P ERCP   2. Cystic fibrosis   3. S/P ERCP with abdominal pain   4. Acute pancreatitis   5. Chronic sinusitis   6. Allergic rhinitis   7. Leukocytosis   8. Abdominal pain, unspecified site    Filed Vitals:   03/29/13 0316 03/29/13 0335 03/29/13 0506 03/29/13 0847  BP: 108/57 115/70 102/61   Pulse: 63 63 58   Temp: 98 F (36.7 C) 97.7 F (36.5 C) 97.7 F (36.5 C)   TempSrc: Oral Oral Oral   Resp: 18 18 18    Height:  5\' 8"  (1.727 m)    Weight:  155 lb 3.3 oz (70.4 kg)    SpO2: 96% 98% 96% 96%     Bryan Alvarado is a 42 y.o. male with severe abdominal pain status post ERCP at [facility] earlier in the day. Patient has mildly elevated lipase at 66, he has elevations in LFTs and also alkaline phosphatase. These elevations or not so high that they are concerning I think this is simply a reaction to the procedure earlier in the day. Patient has a leukocytosis of 13.0 think this is stressed emargination he is afebrile her no signs of infection. Abdominal exam remains nonsurgical in multiple exams. Patient's pain is very difficult to control with IV narcotics. He is also very nervous. Patient will be admitted to the hospital for observation and pain management.  Medications  sodium  chloride 0.9 % bolus 1,000 mL (0 mLs Intravenous Stopped  03/29/13 0100)  ondansetron (ZOFRAN-ODT) disintegrating tablet 4 mg (4 mg Oral Given 03/28/13 2333)  morphine 4 MG/ML injection 4 mg (4 mg Intravenous Given 03/28/13 2333)  HYDROmorphone (DILAUDID) injection 0.5 mg (0.5 mg Intravenous Given 03/29/13 0100)   Note: Portions of this report may have been transcribed using voice recognition software. Every effort was made to ensure accuracy; however, inadvertent computerized transcription errors may be present      Wynetta Emery, PA-C 03/30/13 0011

## 2013-03-28 NOTE — ED Notes (Signed)
Pt presents to the ED with a complaint of abdominal pain.  Pt had a pancreatic stent put in today and was released by [facility] around 1600 hrs.  Pt was in slight pain upon discharge but was able to control pain with medication.  Pt had an increase of pain at 1730.  He took a 15 mg percocet which relieved the pain.  Pt had another increase of pain and took a 10 mg percocet.  Pt pain has not been able to be controlled.  Pt called on call physician at [facility] who advised him to seek medical atention

## 2013-03-29 ENCOUNTER — Encounter (HOSPITAL_COMMUNITY): Payer: Self-pay | Admitting: Internal Medicine

## 2013-03-29 DIAGNOSIS — Z9889 Other specified postprocedural states: Secondary | ICD-10-CM

## 2013-03-29 DIAGNOSIS — R109 Unspecified abdominal pain: Secondary | ICD-10-CM

## 2013-03-29 LAB — COMPREHENSIVE METABOLIC PANEL
Albumin: 3 g/dL — ABNORMAL LOW (ref 3.5–5.2)
BUN: 15 mg/dL (ref 6–23)
Creatinine, Ser: 0.8 mg/dL (ref 0.50–1.35)
GFR calc Af Amer: >90 mL/min (ref >90)
Glucose, Bld: 131 mg/dL — ABNORMAL HIGH (ref 70–99)
Total Bilirubin: 1.5 mg/dL — ABNORMAL HIGH (ref 0.3–1.2)
Total Protein: 5.8 g/dL — ABNORMAL LOW (ref 6.0–8.3)

## 2013-03-29 LAB — LIPASE, BLOOD: Lipase: 53 U/L (ref 11–59)

## 2013-03-29 LAB — HEPATIC FUNCTION PANEL
ALT: 96 U/L — ABNORMAL HIGH (ref 0–53)
AST: 166 U/L — ABNORMAL HIGH (ref 0–37)
Albumin: 3.6 g/dL (ref 3.5–5.2)
Alkaline Phosphatase: 129 U/L — ABNORMAL HIGH (ref 39–117)
Total Protein: 6.9 g/dL (ref 6.0–8.3)

## 2013-03-29 LAB — BASIC METABOLIC PANEL
CO2: 26 mEq/L (ref 19–32)
Chloride: 101 mEq/L (ref 96–112)
Sodium: 136 mEq/L (ref 135–145)

## 2013-03-29 LAB — GLUCOSE, CAPILLARY: Glucose-Capillary: 106 mg/dL — ABNORMAL HIGH (ref 70–99)

## 2013-03-29 MED ORDER — OXYCODONE HCL 5 MG PO TABS
5.0000 mg | ORAL_TABLET | ORAL | Status: DC | PRN
  Administered 2013-03-29: 5 mg via ORAL
  Filled 2013-03-29: qty 1

## 2013-03-29 MED ORDER — FLUTICASONE PROPIONATE 50 MCG/ACT NA SUSP
2.0000 | Freq: Every day | NASAL | Status: DC
  Filled 2013-03-29: qty 16

## 2013-03-29 MED ORDER — ALBUTEROL SULFATE HFA 108 (90 BASE) MCG/ACT IN AERS: 2.0000 | INHALATION_SPRAY | Freq: Two times a day (BID) | RESPIRATORY_TRACT | Status: DC | PRN

## 2013-03-29 MED ORDER — SODIUM CHLORIDE 0.9 % IV SOLN
INTRAVENOUS | Status: DC
  Administered 2013-03-29: 75 mL/h via INTRAVENOUS
  Administered 2013-03-29: 03:00:00 via INTRAVENOUS

## 2013-03-29 MED ORDER — ONDANSETRON HCL 4 MG/2ML IJ SOLN: 4.0000 mg | Freq: Four times a day (QID) | INTRAMUSCULAR | Status: DC | PRN

## 2013-03-29 MED ORDER — ENOXAPARIN SODIUM 40 MG/0.4ML ~~LOC~~ SOLN
40.0000 mg | SUBCUTANEOUS | Status: DC
  Filled 2013-03-29: qty 0.4

## 2013-03-29 MED ORDER — HYDROMORPHONE HCL PF 1 MG/ML IJ SOLN
0.5000 mg | INTRAMUSCULAR | Status: DC | PRN
  Administered 2013-03-29: 0.5 mg via INTRAVENOUS
  Filled 2013-03-29: qty 1

## 2013-03-29 MED ORDER — MOMETASONE FURO-FORMOTEROL FUM 200-5 MCG/ACT IN AERO
2.0000 | INHALATION_SPRAY | Freq: Two times a day (BID) | RESPIRATORY_TRACT | Status: DC
  Administered 2013-03-29: 2 via RESPIRATORY_TRACT
  Filled 2013-03-29: qty 8.8

## 2013-03-29 MED ORDER — ONDANSETRON HCL 4 MG PO TABS: 4.0000 mg | ORAL_TABLET | Freq: Four times a day (QID) | ORAL | Status: DC | PRN

## 2013-03-29 MED ORDER — PANTOPRAZOLE SODIUM 40 MG IV SOLR
40.0000 mg | INTRAVENOUS | Status: DC
  Administered 2013-03-29: 40 mg via INTRAVENOUS
  Filled 2013-03-29 (×2): qty 40

## 2013-03-29 MED ORDER — HYDROMORPHONE HCL PF 1 MG/ML IJ SOLN
0.5000 mg | Freq: Once | INTRAMUSCULAR | Status: AC
  Administered 2013-03-29: 0.5 mg via INTRAVENOUS
  Filled 2013-03-29: qty 1

## 2013-03-29 NOTE — Progress Notes (Signed)
Patient states he is feeling much improved today and would like to be discharged home.  Encouraged patient to ambulate/sit in chair.  Will discuss with his doctor when he makes rounds.

## 2013-03-29 NOTE — Progress Notes (Signed)
Patient already has "My Chart" initiated from previous hospital stay

## 2013-03-29 NOTE — Discharge Summary (Signed)
Physician Discharge Summary  Bryan Alvarado ZOX:[id number] DOB: [birth year] DOA: 03/28/2013  PCP: Theda Belfast, MD  Admit date: 03/28/2013 Discharge date: 03/29/2013  Time spent: 25 minutes  Recommendations for Outpatient Follow-up:  1. Please follow up with Liver enzymes  Discharge Diagnoses:  Principal Problem:   S/P ERCP with abdominal pain Active Problems:   Cystic fibrosis   Discharge Condition: stable  Diet recommendation: Regular diet  Filed Weights   03/28/13 2214 03/29/13 0335  Weight: 68.04 kg (150 lb) 70.4 kg (155 lb 3.3 oz)    History of present illness:  42 y/o with PMH of cystic fibrosis, recurrent pancreatitis, and biliary duct stenosis who presented to the ED complaining of abdominal pain.  Hospital Course:  1. Abdominal discomfort: s/p ERCP - abdominal discomfort resolving. Patient states he does not need any more medication for the discomfort and feels much improved. - Lipase level within normal limits on review - Liver enzymes slightly elevated from initial review.  Will need to be rechecked on post hospital follow up. - Tolerated diet well without any complaints of abdominal discomfort prior to discharge.  Procedures:  None while here, ERCP prior to admission at different hospital facility  Consultations:  none  Discharge Exam: Filed Vitals:   03/29/13 0506  BP: 102/61  Pulse: 58  Temp: 97.7 F (36.5 C)  Resp: 18    General: Pt in NAD, Alert and Awake Cardiovascular: Warm extremities Respiratory: no wheezes, no increased work of breathing.  Discharge Instructions  Discharge Orders   Future Orders Complete By Expires   Call MD for:  persistant nausea and vomiting  As directed    Call MD for:  severe uncontrolled pain  As directed    Call MD for:  temperature >100.4  As directed    Diet - low sodium heart healthy  As directed    Discharge instructions  As directed    Comments:     Follow up with your GI doctor or pcp in 1-2  weeks or sooner should any new concerns arise.   Increase activity slowly  As directed        Medication List    STOP taking these medications       butorphanol 10 MG/ML nasal spray  Commonly known as:  STADOL     ibuprofen 200 MG tablet  Commonly known as:  ADVIL,MOTRIN      TAKE these medications       ADVAIR DISKUS 500-50 MCG/DOSE Aepb  Generic drug:  Fluticasone-Salmeterol  Inhale 1 puff into the lungs every 12 (twelve) hours.     fluticasone 50 MCG/ACT nasal spray  Commonly known as:  FLONASE  Place 2 sprays into the nose daily.     omeprazole 20 MG capsule  Commonly known as:  PRILOSEC  Take 20 mg by mouth every morning.     oxyCODONE 15 MG immediate release tablet  Commonly known as:  ROXICODONE  Take 15 mg by mouth once.     oxyCODONE-acetaminophen 10-325 MG per tablet  Commonly known as:  PERCOCET  Take 1 tablet by mouth every 4 (four) hours as needed for pain.     PROAIR HFA 108 (90 BASE) MCG/ACT inhaler  Generic drug:  albuterol  Inhale 2 puffs into the lungs 2 (two) times daily as needed for wheezing or shortness of breath.       Allergies  Allergen Reactions  . Septra [Bactrim] Hives      The results of significant diagnostics from this  hospitalization (including imaging, microbiology, ancillary and laboratory) are listed below for reference.    Significant Diagnostic Studies: No results found.  Microbiology: No results found for this or any previous visit (from the past 240 hour(s)).   Labs: Basic Metabolic Panel:  Recent Labs Lab 03/28/13 2300 03/29/13 0530  NA 136 137  K 4.1 4.3  CL 101 104  CO2 26 25  GLUCOSE 157* 131*  BUN 16 15  CREATININE 0.82 0.80  CALCIUM 9.2 8.3*   Liver Function Tests:  Recent Labs Lab 03/28/13 2300 03/29/13 0530  AST 166* 269*  ALT 96* 185*  ALKPHOS 129* 131*  BILITOT 1.0 1.5*  PROT 6.9 5.8*  ALBUMIN 3.6 3.0*    Recent Labs Lab 03/28/13 2300 03/29/13 0530  LIPASE 66* 53   No  results found for this basename: AMMONIA,  in the last 168 hours CBC:  Recent Labs Lab 03/28/13 2300  WBC 13.0*  NEUTROABS 12.2*  HGB 13.0  HCT 38.0*  MCV 77.9*  PLT 330   Cardiac Enzymes: No results found for this basename: CKTOTAL, CKMB, CKMBINDEX, TROPONINI,  in the last 168 hours BNP: BNP (last 3 results) No results found for this basename: PROBNP,  in the last 8760 hours CBG:  Recent Labs Lab 03/29/13 0817  GLUCAP 106*       Signed:  Penny Pia  [facility] 03/29/2013, 10:48 AM

## 2013-03-29 NOTE — H&P (Signed)
[facility] History and Physical  Patient: Bryan Alvarado  ZOX:[id number]  DOB: [birth year]  DOA: 03/28/2013  Referring physician: Wynetta Emery, PA-C PCP: Theda Belfast, MD   Chief Complaint: Abdominal pain  HPI: Jasmin Trumbull is a 42 y.o. male with Past medical history of cystic fibrosis, recurrent pancreatitis, biliary duct stenosis. He went to [facility] for an elective ERCP for biliary stent exchange. He was discharged from there at around 3:30. At which time he continues to have some mild abdominal pain. Later on when he reached home the pain got severe, he also had episode of nausea and vomiting. He took 50 mg of Percocet and called his [facility] physician who recommended to go to the ER. At time of my ventilation the patient is pain-free nausea free. Denies any fever, chills, headache, cough, chest pain, palpitation, shortness of breath, orthopnea, PND, nausea, vomiting, diarrhea, constipation, active bleeding, burning urination, dizziness, pedal edema,  focal neurological deficit. His concern about recurrence of the pain, also is concerned as his prior ERCP was complicated by pancreatitis.  Review of Systems: as mentioned in the history of present illness.  A Comprehensive review of the other systems is negative.  Past Medical History  Diagnosis Date  . Cystic fibrosis   . Pancreatic cystic fibrosis   . Sinusitis    Past Surgical History  Procedure Laterality Date  . Frontal sinusotomy    . Cholecystectomy    . Pancreatic stent     Social History:  reports that he has never smoked. He has never used smokeless tobacco. He reports that he does not drink alcohol or use illicit drugs. Patient is coming from home. Independent for most of his  ADL.  Allergies  Allergen Reactions  . Septra [Bactrim] Hives    Family History  Problem Relation Age of Onset  . Cancer Mother   . Hyperlipidemia Father   . Hypertension Father     Prior to Admission medications    Medication Sig Start Date End Date Taking? Authorizing Provider  albuterol (PROAIR HFA) 108 (90 BASE) MCG/ACT inhaler Inhale 2 puffs into the lungs 2 (two) times daily as needed for wheezing or shortness of breath.    Yes Historical Provider, MD  fluticasone (FLONASE) 50 MCG/ACT nasal spray Place 2 sprays into the nose daily.    Yes Historical Provider, MD  Fluticasone-Salmeterol (ADVAIR DISKUS) 500-50 MCG/DOSE AEPB Inhale 1 puff into the lungs every 12 (twelve) hours.     Yes Historical Provider, MD  omeprazole (PRILOSEC) 20 MG capsule Take 20 mg by mouth every morning.   Yes Historical Provider, MD  oxyCODONE (ROXICODONE) 15 MG immediate release tablet Take 15 mg by mouth once.   Yes Historical Provider, MD  oxyCODONE-acetaminophen (PERCOCET) 10-325 MG per tablet Take 1 tablet by mouth every 4 (four) hours as needed for pain.   Yes Historical Provider, MD    Physical Exam: Filed Vitals:   03/28/13 2214  BP: 122/75  Pulse: 62  Temp: 98.3 F (36.8 C)  TempSrc: Oral  Resp: 18  Height: 5\' 8"  (1.727 m)  Weight: 68.04 kg (150 lb)  SpO2: 100%    General: Alert, Awake and Oriented to Time, Place and Person. Appear in mild distress Eyes: PERRL ENT: Oral Mucosa clear moist. Neck:  no  JVD,  no  Carotid Bruits  Cardiovascular: S1 and S2 Present,  no Murmur, Peripheral Pulses Present Respiratory: Bilateral Air entry equal and Decreased, Clear to Auscultation,  no  Crackles, no  wheezes Abdomen: Bowel Sound  Present, Soft and minimally tender in epigastric region, no rebound or rigidity or guarding. Skin:  no  Rash Extremities:  no Pedal edema,  no  calf tenderness Neurologic: Grossly Unremarkable.  Labs on Admission:  CBC:  Recent Labs Lab 03/28/13 2300  WBC 13.0*  NEUTROABS 12.2*  HGB 13.0  HCT 38.0*  MCV 77.9*  PLT 330    CMP     Component Value Date/Time   NA 136 03/28/2013 2300   K 4.1 03/28/2013 2300   CL 101 03/28/2013 2300   CO2 26 03/28/2013 2300   GLUCOSE 157*  03/28/2013 2300   BUN 16 03/28/2013 2300   CREATININE 0.82 03/28/2013 2300   CALCIUM 9.2 03/28/2013 2300   PROT 6.9 03/28/2013 2300   ALBUMIN 3.6 03/28/2013 2300   AST 166* 03/28/2013 2300   ALT 96* 03/28/2013 2300   ALKPHOS 129* 03/28/2013 2300   BILITOT 1.0 03/28/2013 2300   GFRNONAA >90 03/28/2013 2300   GFRAA >90 03/28/2013 2300     Recent Labs Lab 03/28/13 2300  LIPASE 66*   No results found for this basename: AMMONIA,  in the last 168 hours  Cardiac Enzymes: No results found for this basename: CKTOTAL, CKMB, CKMBINDEX, TROPONINI,  in the last 168 hours  BNP (last 3 results) No results found for this basename: PROBNP,  in the last 8760 hours  Radiological Exams on Admission: No results found.  Assessment/Plan Principal Problem:   S/P ERCP with abdominal pain Active Problems:   Cystic fibrosis   1. S/P ERCP Patient has abdominal pain upper ERCP, with mild elevation of his LFTs. Although these findings are normal after the procedure, the pain was significantly worse and required Dilaudid for control. Considering his history of recurrent pancreatitis, and recent ERCP We would admit him for observation for pain control. We'll keep him n.p.o IV fluids will be given. IV pain medications will be given as well as oral pain regimen.  No imaging required at present and his symptoms hemodynamically unstable or develope recurrent abdominal pain   DVT Prophylaxis: subcutaneous Heparin Nutrition:  n.p.o.  Code Status:  full     Author: Lynden Oxford, MD [facility] Pager: [phone] 03/29/2013, 2:13 AM    If 7PM-7AM, please contact night-coverage [email or web address] Password [email or web address]

## 2013-03-30 NOTE — ED Provider Notes (Signed)
Medical screening examination/treatment/procedure(s) were performed by non-physician practitioner and as supervising physician I was immediately available for consultation/collaboration.   Quavis Klutz, MD 03/30/13 0637 

## 2013-12-05 DIAGNOSIS — B370 Candidal stomatitis: Secondary | ICD-10-CM | POA: Insufficient documentation

## 2015-02-22 DIAGNOSIS — J301 Allergic rhinitis due to pollen: Secondary | ICD-10-CM | POA: Insufficient documentation

## 2015-04-16 ENCOUNTER — Other Ambulatory Visit: Payer: Self-pay | Admitting: Gastroenterology

## 2015-04-16 DIAGNOSIS — K861 Other chronic pancreatitis: Secondary | ICD-10-CM

## 2015-07-06 ENCOUNTER — Encounter (HOSPITAL_COMMUNITY): Payer: Self-pay | Admitting: *Deleted

## 2015-07-06 ENCOUNTER — Emergency Department (HOSPITAL_COMMUNITY): Payer: PRIVATE HEALTH INSURANCE

## 2015-07-06 ENCOUNTER — Emergency Department (HOSPITAL_COMMUNITY)
Admission: EM | Admit: 2015-07-06 | Discharge: 2015-07-06 | Disposition: A | Discharge status: Home / Self Care | Payer: PRIVATE HEALTH INSURANCE | Attending: Emergency Medicine | Admitting: Emergency Medicine

## 2015-07-06 DIAGNOSIS — Z8639 Personal history of other endocrine, nutritional and metabolic disease: Secondary | ICD-10-CM | POA: Diagnosis not present

## 2015-07-06 DIAGNOSIS — Y998 Other external cause status: Secondary | ICD-10-CM | POA: Diagnosis not present

## 2015-07-06 DIAGNOSIS — S43014A Anterior dislocation of right humerus, initial encounter: Secondary | ICD-10-CM | POA: Diagnosis not present

## 2015-07-06 DIAGNOSIS — Z8709 Personal history of other diseases of the respiratory system: Secondary | ICD-10-CM | POA: Insufficient documentation

## 2015-07-06 DIAGNOSIS — Z8719 Personal history of other diseases of the digestive system: Secondary | ICD-10-CM | POA: Insufficient documentation

## 2015-07-06 DIAGNOSIS — Z7951 Long term (current) use of inhaled steroids: Secondary | ICD-10-CM | POA: Diagnosis not present

## 2015-07-06 DIAGNOSIS — S43004A Unspecified dislocation of right shoulder joint, initial encounter: Secondary | ICD-10-CM

## 2015-07-06 DIAGNOSIS — Z79899 Other long term (current) drug therapy: Secondary | ICD-10-CM | POA: Diagnosis not present

## 2015-07-06 DIAGNOSIS — Y9322 Activity, ice hockey: Secondary | ICD-10-CM | POA: Diagnosis not present

## 2015-07-06 DIAGNOSIS — W000XXA Fall on same level due to ice and snow, initial encounter: Secondary | ICD-10-CM | POA: Insufficient documentation

## 2015-07-06 DIAGNOSIS — S4991XA Unspecified injury of right shoulder and upper arm, initial encounter: Secondary | ICD-10-CM | POA: Diagnosis present

## 2015-07-06 DIAGNOSIS — Y92330 Ice skating rink (indoor) (outdoor) as the place of occurrence of the external cause: Secondary | ICD-10-CM | POA: Diagnosis not present

## 2015-07-06 MED ORDER — HYDROMORPHONE HCL 1 MG/ML IJ SOLN
1.0000 mg | Freq: Once | INTRAMUSCULAR | Status: AC
  Administered 2015-07-06: 1 mg via INTRAVENOUS
  Filled 2015-07-06: qty 1

## 2015-07-06 MED ORDER — HYDROMORPHONE HCL 1 MG/ML IJ SOLN
0.5000 mg | Freq: Once | INTRAMUSCULAR | Status: AC
  Administered 2015-07-06: 0.5 mg via INTRAVENOUS
  Filled 2015-07-06: qty 1

## 2015-07-06 MED ORDER — PROPOFOL 10 MG/ML IV BOLUS
0.5000 mg/kg | Freq: Once | INTRAVENOUS | Status: AC
  Administered 2015-07-06: 60 mg via INTRAVENOUS
  Filled 2015-07-06: qty 1

## 2015-07-06 MED ORDER — DIAZEPAM 5 MG/ML IJ SOLN
5.0000 mg | Freq: Once | INTRAMUSCULAR | Status: AC
  Administered 2015-07-06: 5 mg via INTRAVENOUS
  Filled 2015-07-06: qty 2

## 2015-07-06 MED ORDER — FENTANYL CITRATE (PF) 100 MCG/2ML IJ SOLN
50.0000 ug | Freq: Once | INTRAMUSCULAR | Status: AC
  Administered 2015-07-06: 50 ug via INTRAVENOUS
  Filled 2015-07-06: qty 2

## 2015-07-06 NOTE — ED Notes (Signed)
Pt was playing hockey when he fell and slammed into the wall, pt states that his right shoulder is dislocated.  Pt reports that this occurred in the past (x2, once in college and once more recently and the last time it came out and went right back in).  Pt denies any other injuries

## 2015-07-06 NOTE — Discharge Instructions (Signed)
Keep your shoulder in the immobilizer and follow up with orthopedics for clearance.   You may follow up with your orthopedic surgeon or the one listed above.  Shoulder Dislocation A shoulder dislocation happens when the upper arm bone (humerus) moves out of the shoulder joint. The shoulder joint is the part of the shoulder where the humerus, shoulder blade (scapula), and collarbone (clavicle) meet. CAUSES This condition is often caused by:  A fall.  A hit to the shoulder.  A forceful movement of the shoulder. RISK FACTORS This condition is more likely to develop in people who play sports. SYMPTOMS Symptoms of this condition include:  Deformity of the shoulder.  Intense pain.  Inability to move the shoulder.  Numbness, weakness, or tingling in your neck or down your arm.  Bruising or swelling around your shoulder. DIAGNOSIS This condition is diagnosed with a physical exam. After the exam, tests may be done to check for related problems. Tests that may be done include:  X-ray. This may be done to check for broken bones.  MRI. This may be done to check for damage to the tissues around the shoulder.  Electromyogram. This may be done to check for nerve damage. TREATMENT This condition is treated with a procedure to place the humerus back in the joint. This procedure is called a reduction. There are two types of reduction:  Closed reduction. In this procedure, the humerus is placed back in the joint without surgery. The health care provider uses his or her hands to guide the bone back into place.  Open reduction. In this procedure, the humerus is placed back in the joint with surgery. An open reduction may be recommended if:  You have a weak shoulder joint or weak ligaments.  You have had more than one shoulder dislocation.  The nerves or blood vessels around your shoulder have been damaged. After the humerus is placed back into the joint, your arm will be placed in a splint  or sling to prevent it from moving. You will need to wear the splint or sling until your shoulder heals. When the splint or sling is removed, you may have physical therapy to help improve the range of motion in your shoulder joint. HOME CARE INSTRUCTIONS If You Have a Splint or Sling:  Wear it as told by your health care provider. Remove it only as told by your health care provider.  Loosen it if your fingers become numb and tingle, or if they turn cold and blue.  Keep it clean and dry. Bathing  Do not take baths, swim, or use a hot tub until your health care provider approves. Ask your health care provider if you can take showers. You may only be allowed to take sponge baths for bathing.  If your health care provider approves bathing and showering, cover your splint or sling with a watertight plastic bag to protect it from water. Do not let the splint or sling get wet. Managing Pain, Stiffness, and Swelling  If directed, apply ice to the injured area.  Put ice in a plastic bag.  Place a towel between your skin and the bag.  Leave the ice on for 20 minutes, 2-3 times per day.  Move your fingers often to avoid stiffness and to decrease swelling.  Raise (elevate) the injured area above the level of your heart while you are sitting or lying down. Driving  Do not drive while wearing a splint or sling on a hand that you use for  driving.  Do not drive or operate heavy machinery while taking pain medicine. Activity  Return to your normal activities as told by your health care provider. Ask your health care provider what activities are safe for you.  Perform range-of-motion exercises only as told by your health care provider.  Exercise your hand by squeezing a soft ball. This helps to decrease stiffness and swelling in your hand and wrist. General Instructions  Take over-the-counter and prescription medicines only as told by your health care provider.  Do not use any tobacco  products, including cigarettes, chewing tobacco, or e-cigarettes. Tobacco can delay bone and tissue healing. If you need help quitting, ask your health care provider.  Keep all follow-up visits as told by your health care provider. This is important. SEEK MEDICAL CARE IF:  Your splint or sling gets damaged. SEEK IMMEDIATE MEDICAL CARE IF:  Your pain gets worse rather than better.  You lose feeling in your arm or hand.  Your arm or hand becomes white and cold.   This information is not intended to replace advice given to you by your health care provider. Make sure you discuss any questions you have with your health care provider.   Document Released: 04/15/2001 Document Revised: 04/11/2015 Document Reviewed: 11/13/2014 Elsevier Interactive Patient Education [employer]2016 [employer] Inc[employer]

## 2015-07-06 NOTE — ED Notes (Signed)
Propofol given for R shoulder reduction.  RN and EDP at the bedside as well as ambu bag (which was not needed).  Pt was speaking with us during entire procedure but did not remember this after procedure.  Pt tolerated procedure well.  Consent was signed before and time out was performed before also.  Right shoulder immobilizer placed immediately after.  Pt remains on monitor during procedure and after

## 2015-07-06 NOTE — ED Provider Notes (Signed)
CSN: [id number]     Arrival date & time 07/06/15  1918 History   First MD Initiated Contact with Patient 07/06/15 1951     Chief Complaint  Patient presents with  . Shoulder Injury     (Consider location/radiation/quality/duration/timing/severity/associated sxs/prior Treatment) HPI Comments: 44 y.o. Male with history of CF, chronic pancreatitis presents for right shoulder injury.  The patient was playing hockey and slid on the ice on his shoulder and felt it come out of place.  He has had pain in that shoulder since the time of the injury.  Denies other injury.  Reports history of this happening a few times in the past.     Past Medical History  Diagnosis Date  . Cystic fibrosis   . Pancreatic cystic fibrosis (HCC)   . Sinusitis    Past Surgical History  Procedure Laterality Date  . Frontal sinusotomy    . Cholecystectomy    . Pancreatic stent     Family History  Problem Relation Age of Onset  . Cancer Mother   . Hyperlipidemia Father   . Hypertension Father    Social History  Substance Use Topics  . Smoking status: Never Smoker   . Smokeless tobacco: Never Used  . Alcohol Use: No    Review of Systems  Constitutional: Negative for chills, appetite change and fatigue.  HENT: Negative for congestion, rhinorrhea and sinus pressure.   Respiratory: Negative for cough, chest tightness and shortness of breath.   Gastrointestinal: Negative for nausea, vomiting, abdominal pain, diarrhea and constipation.  Genitourinary: Negative for dysuria, urgency and hematuria.  Musculoskeletal: Positive for arthralgias (right shoulder pain). Negative for myalgias, back pain, joint swelling, gait problem, neck pain and neck stiffness.  Skin: Negative for rash.  Neurological: Negative for dizziness, seizures, weakness, numbness and headaches.      Allergies  Septra  Home Medications   Prior to Admission medications   Medication Sig Start Date End Date Taking? Authorizing Provider   albuterol (PROAIR HFA) 108 (90 BASE) MCG/ACT inhaler Inhale 2 puffs into the lungs 2 (two) times daily as needed for wheezing or shortness of breath.    Yes Historical Provider, MD  Ascorbic Acid (VITAMIN C) 1000 MG tablet Take 1,000 mg by mouth daily.   Yes Historical Provider, MD  ascorbic acid (VITAMIN C) 250 MG tablet Take 250 mg by mouth daily.   Yes Historical Provider, MD  cetirizine (ZYRTEC) 10 MG tablet Take 10 mg by mouth daily.    Yes Historical Provider, MD  cholecalciferol (VITAMIN D) 1000 UNITS tablet Take 1,000 Units by mouth daily.   Yes Historical Provider, MD  fluticasone (FLONASE) 50 MCG/ACT nasal spray Place 2 sprays into the nose daily.    Yes Historical Provider, MD  Fluticasone-Salmeterol (ADVAIR DISKUS) 500-50 MCG/DOSE AEPB Inhale 1 puff into the lungs every 12 (twelve) hours.     Yes Historical Provider, MD  montelukast (SINGULAIR) 10 MG tablet Take 10 mg by mouth at bedtime.  01/25/15  Yes Historical Provider, MD  omeprazole (PRILOSEC) 20 MG capsule Take 20 mg by mouth every morning.   Yes Historical Provider, MD  oxycodone (OXY-IR) 5 MG capsule Take 5 mg by mouth every 4 (four) hours as needed for pain.   Yes Historical Provider, MD  Probiotic Product (PROBIOTIC DAILY) CAPS Take 1 capsule by mouth daily.   Yes Historical Provider, MD  vitamin E 400 UNIT capsule Take 400 Units by mouth daily.   Yes Historical Provider, MD   BP 112/80  mmHg  Pulse 79  Temp(Src) 97.5 F (36.4 C) (Oral)  Resp 24  Ht 5\' 8"  (1.727 m)  Wt 155 lb (70.308 kg)  BMI 23.57 kg/m2  SpO2 100% Physical Exam  Constitutional: He is oriented to person, place, and time. He appears well-developed and well-nourished. No distress.  HENT:  Head: Normocephalic and atraumatic.  Right Ear: External ear normal.  Left Ear: External ear normal.  Mouth/Throat: Oropharynx is clear and moist. No oropharyngeal exudate.  Eyes: EOM are normal. Pupils are equal, round, and reactive to light.  Neck: Normal range  of motion and full passive range of motion without pain. Neck supple. No spinous process tenderness and no muscular tenderness present.  Cardiovascular: Normal rate, regular rhythm, normal heart sounds and intact distal pulses.   No murmur heard. Pulmonary/Chest: Effort normal. No respiratory distress. He has no wheezes. He has no rales.  Abdominal: Soft. He exhibits no distension. There is no tenderness.  Musculoskeletal: He exhibits no edema.       Right shoulder: He exhibits decreased range of motion, tenderness, deformity, pain, spasm and decreased strength. He exhibits normal pulse.       Left shoulder: Normal.       Right elbow: Normal.      Left elbow: Normal.       Right wrist: Normal.       Left wrist: Normal.       Right hip: Normal.       Left hip: Normal.  Neurological: He is alert and oriented to person, place, and time.  Skin: Skin is warm and dry. No rash noted. He is not diaphoretic.  Vitals reviewed.   ED Course  Reduction of dislocation Date/Time: 07/06/2015 9:58 PM Performed by: Tyrone AppleNGUYEN, Avneet Ashmore ROE Authorized by: Leta BaptistNGUYEN, Tank Difiore ROE Consent: Verbal consent obtained. Written consent obtained. Risks and benefits: risks, benefits and alternatives were discussed Consent given by: patient Patient understanding: patient states understanding of the procedure being performed Patient consent: the patient's understanding of the procedure matches consent given Required items: required blood products, implants, devices, and special equipment available Patient identity confirmed: verbally with patient and arm band Local anesthesia used: no Patient sedated: yes Sedatives: propofol Patient tolerance: Patient tolerated the procedure well with no immediate complications Comments: Right shoulder reduction completed at bedside without complication  .Sedation Date/Time: 07/06/2015 9:59 PM Performed by: Tyrone AppleNGUYEN, Jarryn Altland ROE Authorized by: Leta BaptistNGUYEN, Kass Herberger ROE  Consent:    Consent  obtained:  Written   Consent given by:  Patient   Risks discussed:  Nausea, respiratory compromise necessitating ventilatory assistance and intubation, prolonged hypoxia resulting in organ damage and inadequate sedation   Alternatives discussed:  Analgesia without sedation and anxiolysis Indications:    Sedation purpose:  Dislocation reduction   Procedure necessitating sedation performed by:  Physician performing sedation   Intended level of sedation:  Moderate (conscious sedation) Pre-sedation assessment:    ASA classification: class 2 - patient with mild systemic disease     Neck mobility: normal     Mouth opening:  3 or more finger widths   Mallampati score:  II - soft palate, uvula, fauces visible   Pre-sedation assessments completed and reviewed: airway patency, cardiovascular function, mental status, nausea/vomiting, pain level and respiratory function   Immediate pre-procedure details:    Reviewed: vital signs     Verified: bag valve mask available, emergency equipment available, IV patency confirmed, oxygen available and suction available   Procedure details (see MAR for exact dosages):  Preoxygenation:  Nasal cannula   Sedation:  Propofol   Analgesia:  Hydromorphone   Intra-procedure monitoring:  Blood pressure monitoring, cardiac monitor, continuous pulse oximetry, frequent LOC assessments and frequent vital sign checks   Intra-procedure events: none   Post-procedure details:    Attendance: Constant attendance by certified staff until patient recovered     Recovery: Patient returned to pre-procedure baseline     Post-sedation assessments completed and reviewed: airway patency, cardiovascular function, mental status, nausea/vomiting and respiratory function     Patient is stable for discharge or admission: Yes     Patient tolerance:  Tolerated well, no immediate complications Comments:     Patient tolerated sedation well without complication  (including critical care  time) Labs Review Labs Reviewed - No data to display  Imaging Review Dg Shoulder Right  07/06/2015  CLINICAL DATA:  Post right shoulder reduction EXAM: RIGHT SHOULDER - 2+ VIEW COMPARISON:  Right shoulder radiographs from earlier today FINDINGS: Successful reduction of the right shoulder dislocation, with no residual malalignment. Suggestion of a Hill-Sachs deformity in the posterior superior right humeral head. No focal osseous lesion. Right acromioclavicular and glenohumeral joints are within normal limits. No pathologic soft tissue calcifications. IMPRESSION: 1. Successful reduction of the right shoulder dislocation, with no residual malalignment. 2. Suggestion of a Hill-Sachs deformity in the posterior superior right humeral head. Electronically Signed   By: Delbert Phenix M.D.   On: 07/06/2015 21:56   Dg Shoulder Right  07/06/2015  CLINICAL DATA:  Right shoulder trauma with dislocation EXAM: RIGHT SHOULDER - 2+ VIEW COMPARISON:  None. FINDINGS: There is anterior dislocation of the right humeral head at the right glenohumeral joint. No fracture, Hill-Sachs deformity or suspicious focal osseous lesion. No pathologic soft tissue calcifications. No appreciable degenerative or erosive arthropathy. IMPRESSION: Anterior dislocation of the right humeral head at the right glenohumeral joint. No fracture on these views. Recommend postreduction radiographs for further evaluation. Electronically Signed   By: Delbert Phenix M.D.   On: 07/06/2015 20:51   I have personally reviewed and evaluated these images and lab results as part of my medical decision-making.   EKG Interpretation None      MDM  Patient seen and evaluated in stable condition.  Neurovascularly intact.  Right shoulder dislocation without fracture.  Reduction completed without complication as detailed above.  Patient placed in shoulder immobilizer.  Discharged in stable condition with instruction to follow up with orthopedics outpatient. Final  diagnoses:  None    1. Right shoulder dislocation, reduced    Leta Baptist, MD 07/06/15 2205

## 2015-07-06 NOTE — ED Notes (Signed)
Sling teaching done, pt leaves ambulatory with father

## 2015-07-06 NOTE — ED Notes (Signed)
Xray at the bedside.  Pt states that he feels much better, family is at the bedside

## 2015-09-03 DIAGNOSIS — M24411 Recurrent dislocation, right shoulder: Secondary | ICD-10-CM | POA: Insufficient documentation

## 2016-10-30 DIAGNOSIS — M79641 Pain in right hand: Secondary | ICD-10-CM | POA: Insufficient documentation

## 2017-04-14 DIAGNOSIS — M19041 Primary osteoarthritis, right hand: Secondary | ICD-10-CM | POA: Insufficient documentation

## 2017-04-25 IMAGING — DX DG SHOULDER 2+V*R*
2 series · 2 of 2 positions shown · non-contrast
Comparison: Right shoulder radiographs from earlier today

CLINICAL DATA: Post right shoulder reduction

EXAM:
RIGHT SHOULDER - 2+ VIEW

[shoulder ap]
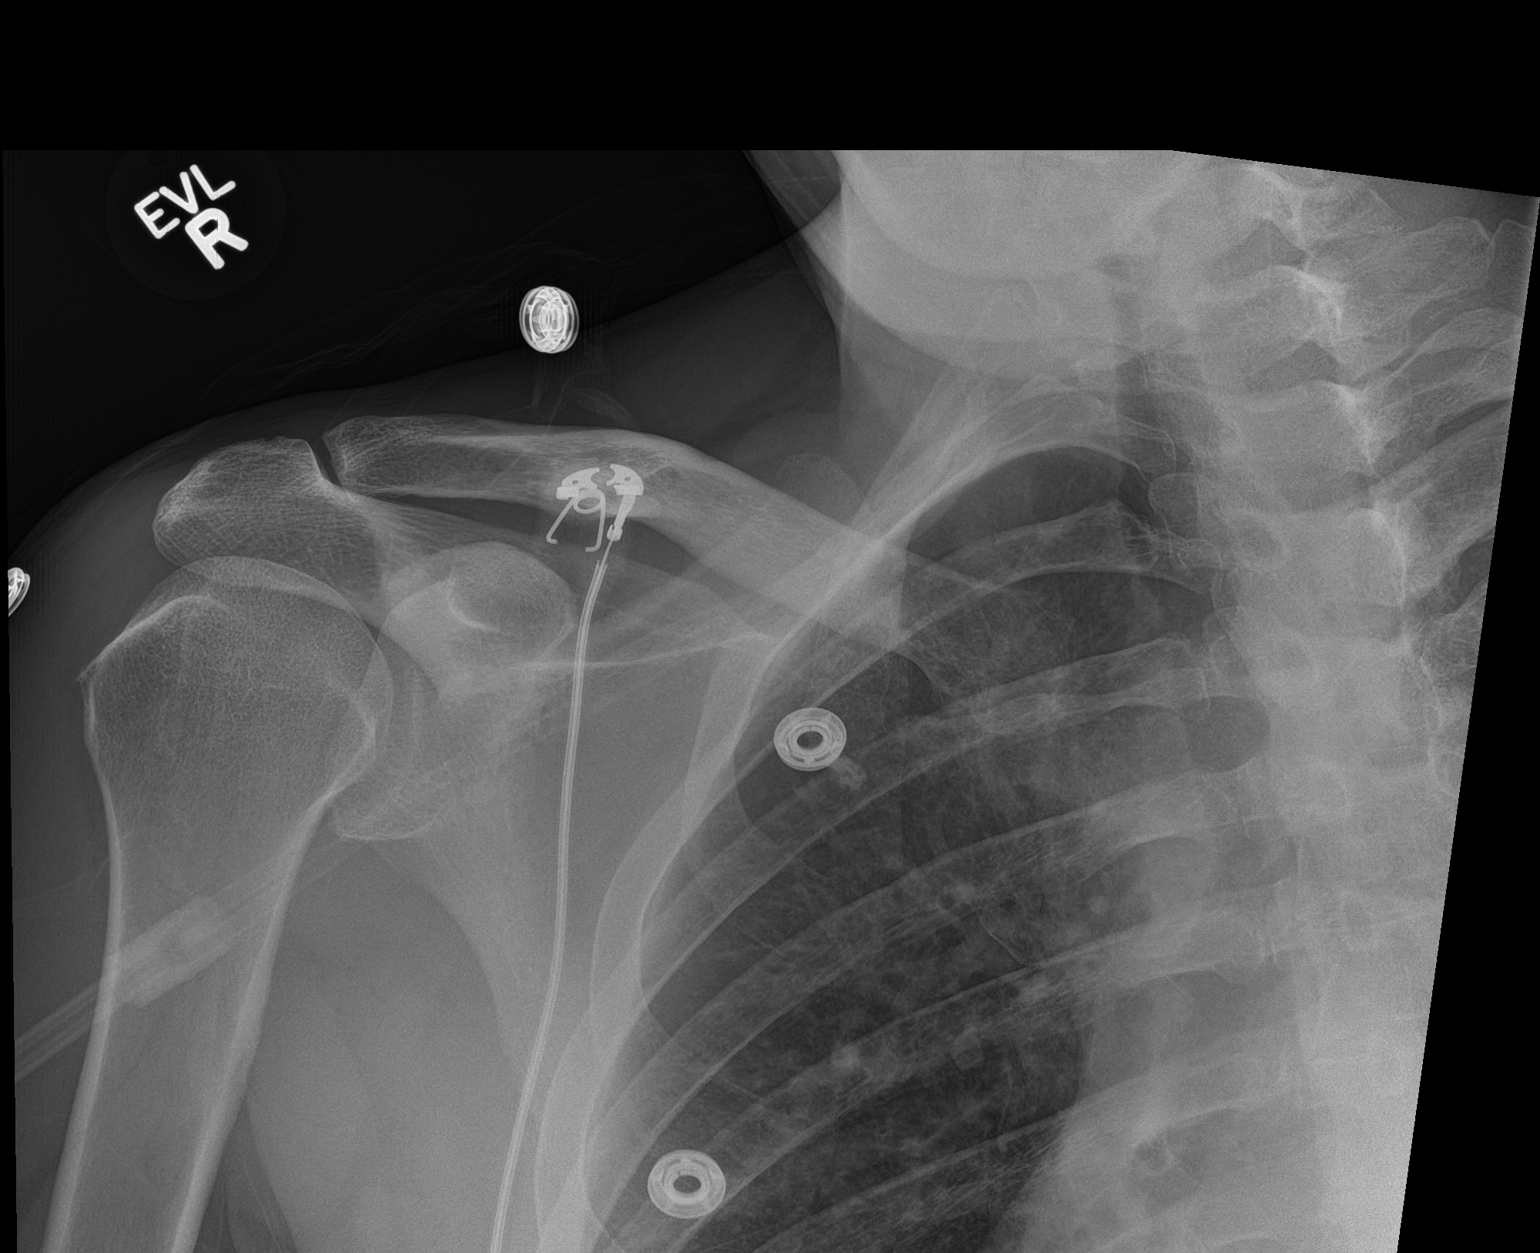

[shoulder obl]
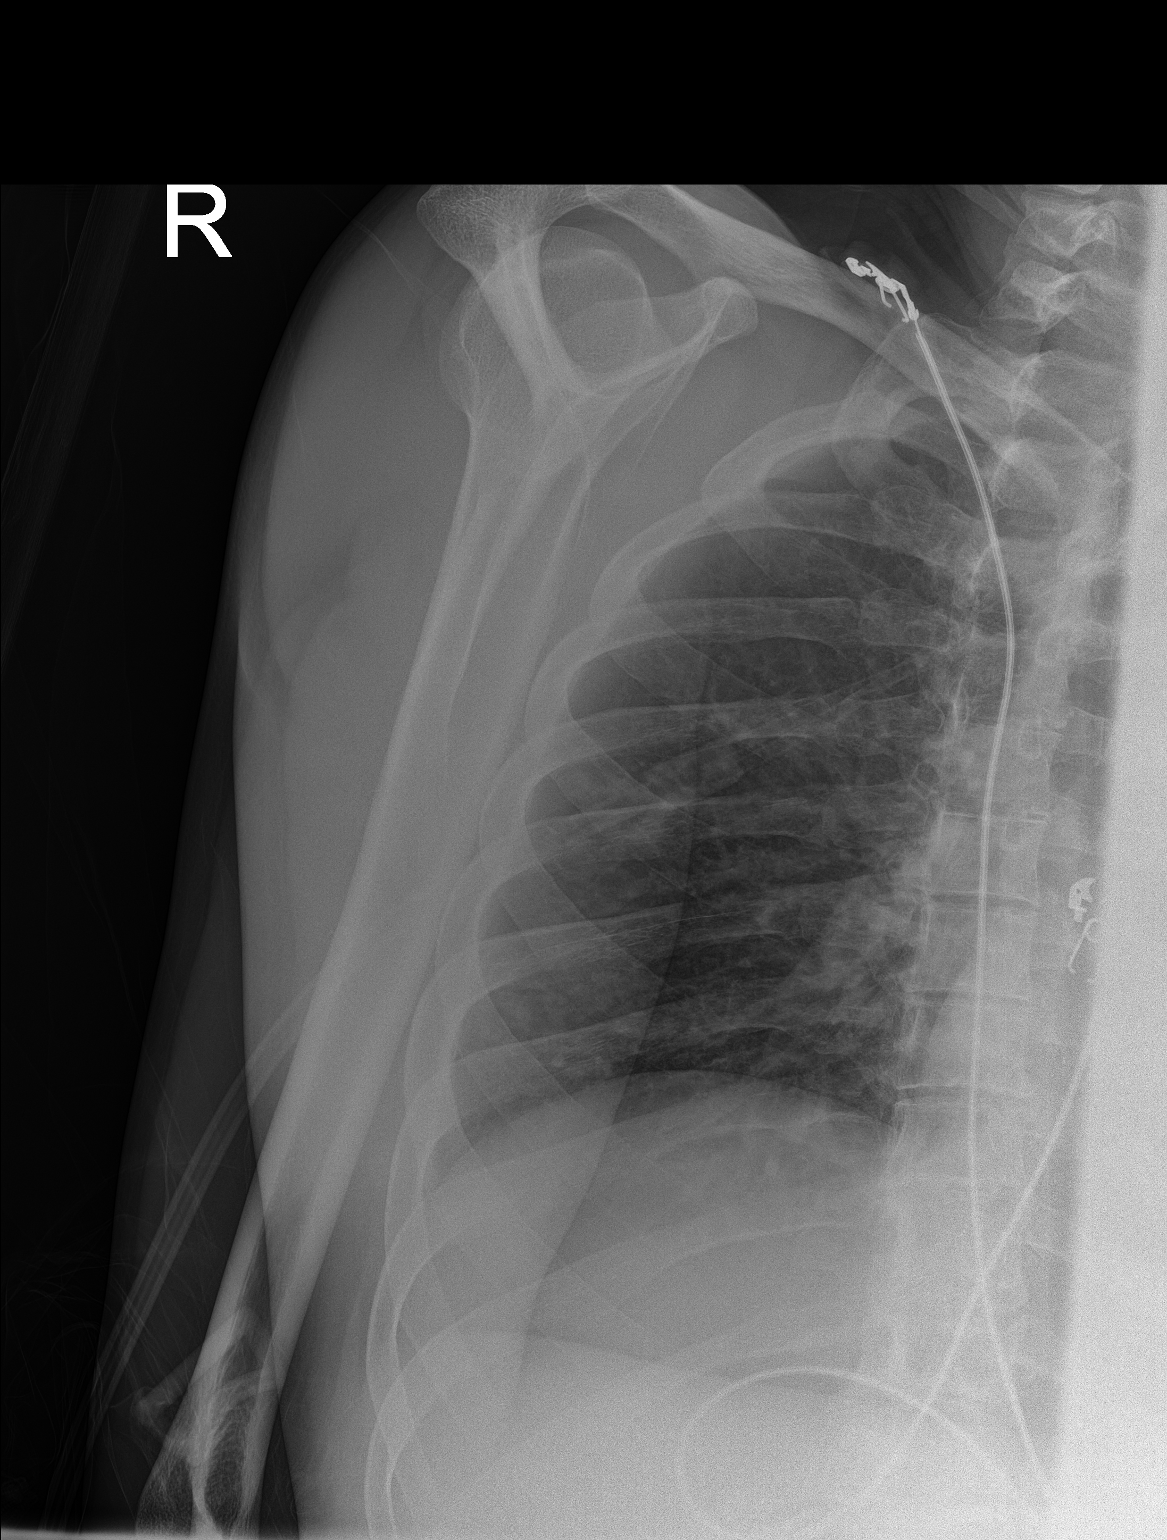

[2 of 2 positions shown; findings below may reference images not displayed]

FINDINGS: Successful reduction of the right shoulder dislocation, with no
residual malalignment. Suggestion of a Hill-Sachs deformity in the
posterior superior right humeral head. No focal osseous lesion.
Right acromioclavicular and glenohumeral joints are within normal
limits. No pathologic soft tissue calcifications.
IMPRESSION: 1. Successful reduction of the right shoulder dislocation, with no
residual malalignment.
2. Suggestion of a Hill-Sachs deformity in the posterior superior
right humeral head.

## 2017-05-25 DIAGNOSIS — F320 Major depressive disorder, single episode, mild: Secondary | ICD-10-CM | POA: Insufficient documentation

## 2019-06-09 DIAGNOSIS — F411 Generalized anxiety disorder: Secondary | ICD-10-CM | POA: Insufficient documentation

## 2019-09-26 ENCOUNTER — Ambulatory Visit: Payer: 59 | Attending: Family

## 2019-09-26 DIAGNOSIS — Z23 Encounter for immunization: Secondary | ICD-10-CM | POA: Insufficient documentation

## 2019-09-26 NOTE — Progress Notes (Signed)
   Covid-19 Vaccination Clinic  Name:  Bryan Alvarado    MRN: [id number] DOB: May 10, 1971  09/26/2019  Bryan Alvarado was observed post Covid-19 immunization for 15 minutes without incidence. He was provided with Vaccine Information Sheet and instruction to access the V-Safe system.   Bryan Alvarado was instructed to call 911 with any severe reactions post vaccine: Marland Kitchen Difficulty breathing  . Swelling of your face and throat  . A fast heartbeat  . A bad rash all over your body  . Dizziness and weakness    Immunizations Administered    Name Date Dose VIS Date Route   Moderna COVID-19 Vaccine 09/26/2019  1:16 PM 0.5 mL 07/05/2019 Intramuscular   Manufacturer: Moderna   Lot: [id number]   NDC: 43142-767-01

## 2019-11-01 ENCOUNTER — Ambulatory Visit: Payer: 59 | Attending: Family

## 2019-11-01 DIAGNOSIS — Z23 Encounter for immunization: Secondary | ICD-10-CM

## 2019-11-01 NOTE — Progress Notes (Signed)
   Covid-19 Vaccination Clinic  Name:  Bryan Alvarado    MRN: [id number] DOB: 04-Jul-1971  11/01/2019  Mr. Bryan Alvarado was observed post Covid-19 immunization for 15 minutes without incident. He was provided with Vaccine Information Sheet and instruction to access the V-Safe system.   Mr. Bryan Alvarado was instructed to call 911 with any severe reactions post vaccine: Marland Kitchen Difficulty breathing  . Swelling of face and throat  . A fast heartbeat  . A bad rash all over body  . Dizziness and weakness   Immunizations Administered    Name Date Dose VIS Date Route   Moderna COVID-19 Vaccine 11/01/2019  2:00 PM 0.5 mL 07/05/2019 Intramuscular   Manufacturer: Moderna   Lot: [id number]   NDC: 49611-643-53

## 2020-10-18 ENCOUNTER — Ambulatory Visit (INDEPENDENT_AMBULATORY_CARE_PROVIDER_SITE_OTHER): Payer: 59 | Admitting: Family Medicine

## 2020-10-18 ENCOUNTER — Other Ambulatory Visit: Payer: Self-pay

## 2020-10-18 ENCOUNTER — Encounter: Payer: Self-pay | Admitting: Family Medicine

## 2020-10-18 VITALS — BP 130/80 | HR 72 | Temp 98.4°F | Ht 69.0 in | Wt 178.8 lb

## 2020-10-18 DIAGNOSIS — Z1211 Encounter for screening for malignant neoplasm of colon: Secondary | ICD-10-CM

## 2020-10-18 DIAGNOSIS — Z6826 Body mass index (BMI) 26.0-26.9, adult: Secondary | ICD-10-CM

## 2020-10-18 DIAGNOSIS — Z13220 Encounter for screening for lipoid disorders: Secondary | ICD-10-CM

## 2020-10-18 DIAGNOSIS — Z0001 Encounter for general adult medical examination with abnormal findings: Secondary | ICD-10-CM | POA: Diagnosis not present

## 2020-10-18 DIAGNOSIS — E663 Overweight: Secondary | ICD-10-CM

## 2020-10-18 DIAGNOSIS — K219 Gastro-esophageal reflux disease without esophagitis: Secondary | ICD-10-CM

## 2020-10-18 DIAGNOSIS — K529 Noninfective gastroenteritis and colitis, unspecified: Secondary | ICD-10-CM | POA: Insufficient documentation

## 2020-10-18 DIAGNOSIS — B351 Tinea unguium: Secondary | ICD-10-CM

## 2020-10-18 DIAGNOSIS — F325 Major depressive disorder, single episode, in full remission: Secondary | ICD-10-CM

## 2020-10-18 DIAGNOSIS — J309 Allergic rhinitis, unspecified: Secondary | ICD-10-CM

## 2020-10-18 LAB — LIPID PANEL
Cholesterol: 212 mg/dL — ABNORMAL HIGH (ref 0–200)
HDL: 58.9 mg/dL (ref >39.00)
LDL Cholesterol: 125 mg/dL — ABNORMAL HIGH (ref 0–99)
NonHDL: 153.46
Total CHOL/HDL Ratio: 4
Triglycerides: 143 mg/dL (ref 0.0–149.0)
VLDL: 28.6 mg/dL (ref 0.0–40.0)

## 2020-10-18 NOTE — Patient Instructions (Signed)
It was very nice to see you today!  We will check your cholesterol numbers today.  I will place a referral for you to see the gastroenterologist for your colon cancer screening and also to see a podiatrist for your toenail fungus.  I will see back in year for your next annual checkup.  Come back to see me sooner if needed.  Take care, Dr Jimmey Ralph  PLEASE NOTE:  If you had any lab tests please let us know if you have not heard back within a few days. You may see your results on mychart before we have a chance to review them but we will give you a call once they are reviewed by [place]. If we ordered any referrals today, please let us know if you have not heard from their office within the next week.   Please try these tips to maintain a healthy lifestyle:   Eat at least 3 REAL meals and 1-2 snacks per day.  Aim for no more than 5 hours between eating.  If you eat breakfast, please do so within one hour of getting up.    Each meal should contain half fruits/vegetables, one quarter protein, and one quarter carbs (no bigger than a computer mouse)   Cut down on sweet beverages. This includes juice, soda, and sweet tea.     Drink at least 1 glass of water with each meal and aim for at least 8 glasses per day   Exercise at least 150 minutes every week.    Preventive Care 85-83 Years Old, Male Preventive care refers to lifestyle choices and visits with your health care provider that can promote health and wellness. This includes:  A yearly physical exam. This is also called an annual wellness visit.  Regular dental and eye exams.  Immunizations.  Screening for certain conditions.  Healthy lifestyle choices, such as: ? Eating a healthy diet. ? Getting regular exercise. ? Not using drugs or products that contain nicotine and tobacco. ? Limiting alcohol use. What can I expect for my preventive care visit? Physical exam Your health care provider will check your:  Height and weight.  These may be used to calculate your BMI (body mass index). BMI is a measurement that tells if you are at a healthy weight.  Heart rate and blood pressure.  Body temperature.  Skin for abnormal spots. Counseling Your health care provider may ask you questions about your:  Past medical problems.  Family's medical history.  Alcohol, tobacco, and drug use.  Emotional well-being.  Home life and relationship well-being.  Sexual activity.  Diet, exercise, and sleep habits.  Work and work Astronomer.  Access to firearms. What immunizations do I need? Vaccines are usually given at various ages, according to a schedule. Your health care provider will recommend vaccines for you based on your age, medical history, and lifestyle or other factors, such as travel or where you work.   What tests do I need? Blood tests  Lipid and cholesterol levels. These may be checked every 5 years, or more often if you are over 25 years old.  Hepatitis C test.  Hepatitis B test. Screening  Lung cancer screening. You may have this screening every year starting at age 63 if you have a 30-pack-year history of smoking and currently smoke or have quit within the past 15 years.  Prostate cancer screening. Recommendations will vary depending on your family history and other risks.  Genital exam to check for testicular cancer or hernias.  Colorectal cancer screening. ? All adults should have this screening starting at age 80 and continuing until age 32. ? Your health care provider may recommend screening at age 29 if you are at increased risk. ? You will have tests every 1-10 years, depending on your results and the type of screening test.  Diabetes screening. ? This is done by checking your blood sugar (glucose) after you have not eaten for a while (fasting). ? You may have this done every 1-3 years.  STD (sexually transmitted disease) testing, if you are at risk. Follow these instructions at  home: Eating and drinking  Eat a diet that includes fresh fruits and vegetables, whole grains, lean protein, and low-fat dairy products.  Take vitamin and mineral supplements as recommended by your health care provider.  Do not drink alcohol if your health care provider tells you not to drink.  If you drink alcohol: ? Limit how much you have to 0-2 drinks a day. ? Be aware of how much alcohol is in your drink. In the [place] one drink equals one 12 oz bottle of beer (355 mL), one 5 oz glass of wine (148 mL), or one 1 oz glass of hard liquor (44 mL).   Lifestyle  Take daily care of your teeth and gums. Brush your teeth every morning and night with fluoride toothpaste. Floss one time each day.  Stay active. Exercise for at least 30 minutes 5 or more days each week.  Do not use any products that contain nicotine or tobacco, such as cigarettes, e-cigarettes, and chewing tobacco. If you need help quitting, ask your health care provider.  Do not use drugs.  If you are sexually active, practice safe sex. Use a condom or other form of protection to prevent STIs (sexually transmitted infections).  If told by your health care provider, take low-dose aspirin daily starting at age 64.  Find healthy ways to cope with stress, such as: ? Meditation, yoga, or listening to music. ? Journaling. ? Talking to a trusted person. ? Spending time with friends and family. Safety  Always wear your seat belt while driving or riding in a vehicle.  Do not drive: ? If you have been drinking alcohol. Do not ride with someone who has been drinking. ? When you are tired or distracted. ? While texting.  Wear a helmet and other protective equipment during sports activities.  If you have firearms in your house, make sure you follow all gun safety procedures. What's next?  Go to your health care provider once a year for an annual wellness visit.  Ask your health care provider how often you should have your  eyes and teeth checked.  Stay up to date on all vaccines. This information is not intended to replace advice given to you by your health care provider. Make sure you discuss any questions you have with your health care provider. Document Revised: 04/19/2019 Document Reviewed: 07/15/2018 Elsevier Patient Education  2021 [employer].

## 2020-10-18 NOTE — Assessment & Plan Note (Addendum)
Longstanding history.  Has tried terbinafine in the past but has not had much success.  We discussed restarting terbinafine today however I am not sure how will interact with his Trikafta.  Will place referral to podiatry to discuss other treatment options.losartan

## 2020-10-18 NOTE — Assessment & Plan Note (Signed)
Follows with GI at [facility]. Taking cholestyramine.

## 2020-10-18 NOTE — Assessment & Plan Note (Addendum)
Follows with pulmonology at [facility]. Currently on albuterol, trikafta, advair.

## 2020-10-18 NOTE — Progress Notes (Signed)
Chief Complaint:  Bryan Alvarado is a 50 y.o. male who presents today for his annual comprehensive physical exam.  He is a new patient.   Assessment/Plan:  Chronic Problems Addressed Today: Cystic fibrosis Follows with pulmonology at [facility]. Currently on albuterol, trikafta, advair.  Chronic diarrhea Follows with GI at [facility]. Taking cholestyramine.   Allergic rhinitis Stable.  Continue Flonase.  Major depression in remission (HCC) Stable on Zoloft 100 mg daily.  GERD (gastroesophageal reflux disease) Stable on omeprazole 20 mg daily.  Onychomycosis Longstanding history.  Has tried terbinafine in the past but has not had much success.  We discussed restarting terbinafine today however I am not sure how will interact with his Trikafta.  Will place referral to podiatry to discuss other treatment options.losartan   Body mass index is 26.4 kg/m. / Overweight  BMI Metric Follow Up - 10/18/20 1131      BMI Metric Follow Up-Please document annually   BMI Metric Follow Up Education provided            Preventative Healthcare: Check lipids today.  Will place referral for colonoscopy.  He would like to get this done locally as opposed to [facility].  Patient Counseling(The following topics were reviewed and/or handout was given):  -Nutrition: Stressed importance of moderation in sodium/caffeine intake, saturated fat and cholesterol, caloric balance, sufficient intake of fresh fruits, vegetables, and fiber.  -Stressed the importance of regular exercise.   -Substance Abuse: Discussed cessation/primary prevention of tobacco, alcohol, or other drug use; driving or other dangerous activities under the influence; availability of treatment for abuse.   -Injury prevention: Discussed safety belts, safety helmets, smoke detector, smoking near bedding or upholstery.   -Sexuality: Discussed sexually transmitted diseases, partner selection, use of condoms, avoidance of unintended pregnancy and  contraceptive alternatives.   -Dental health: Discussed importance of regular tooth brushing, flossing, and dental visits.  -Health maintenance and immunizations reviewed. Please refer to Health maintenance section.  Return to care in 1 year for next preventative visit.     Subjective:  HPI:  He has no acute complaints today. See a/p for status of chronic conditions.   Lifestyle Diet: Balanced.  Exercise: Limited.   No flowsheet data found.  Health Maintenance Due  Topic Date Due  . Hepatitis C Screening  Never done  . HIV Screening  Never done  . COLONOSCOPY (Pts 45-36yrs Insurance coverage will need to be confirmed)  Never done     ROS: Per HPI, otherwise a complete review of systems was negative.   PMH:  The following were reviewed and entered/updated in epic: Past Medical History:  Diagnosis Date  . Cystic fibrosis   . Depression   . Pancreatic cystic fibrosis (HCC)   . Sinusitis    Patient Active Problem List   Diagnosis Date Noted  . Chronic diarrhea 10/18/2020  . GERD (gastroesophageal reflux disease) 10/18/2020  . Major depression in remission (HCC) 10/18/2020  . Onychomycosis 10/18/2020  . Cystic fibrosis (HCC) 06/12/2012  . Chronic sinusitis 06/12/2012  . Allergic rhinitis 06/12/2012   Past Surgical History:  Procedure Laterality Date  . CHOLECYSTECTOMY    . FRONTAL SINUSOTOMY    . pancreatic stent      Family History  Problem Relation Age of Onset  . Cancer Mother   . Hyperlipidemia Father   . Hypertension Father   . Cancer Father   . Hyperlipidemia Brother     Medications- reviewed and updated Current Outpatient Medications  Medication Sig Dispense Refill  .  albuterol (VENTOLIN HFA) 108 (90 Base) MCG/ACT inhaler Inhale 2 puffs into the lungs 2 (two) times daily as needed for wheezing or shortness of breath.    . Ascorbic Acid (VITAMIN C) 1000 MG tablet Take 1,000 mg by mouth daily.    . cetirizine (ZYRTEC) 10 MG tablet Take 10 mg by  mouth daily.     . cholecalciferol (VITAMIN D) 1000 UNITS tablet Take 1,000 Units by mouth daily.    . cholestyramine (QUESTRAN) 4 g packet Take by mouth.    . diclofenac Sodium (VOLTAREN) 1 % GEL Apply one inch to affected joint QID prn pain    . Elexacaf-Tezacaf-Ivacaf&Ivacaf (TRIKAFTA) 100-50-75 & 150 MG TBPK Take 2 Tablets (Elexacaftor 100mg /Tezacaftor 50mg /Ivacaftor 75mg ) by mouth in the AM and 1 tablet (ivacaftor 150mg ) in the PM w/fatty food    . fluticasone (FLONASE) 50 MCG/ACT nasal spray Place 2 sprays into the nose daily.    . Fluticasone-Salmeterol (ADVAIR) 500-50 MCG/DOSE AEPB Inhale 1 puff into the lungs every 12 (twelve) hours.    . Hypertonic Nasal Wash (SINUS RINSE REFILL) PACK 0.5 Bottles.    omeprazole (PRILOSEC) 20 MG capsule Take 20 mg by mouth every morning.    . ondansetron (ZOFRAN-ODT) 4 MG disintegrating tablet Take by mouth.    . Probiotic Product (PROBIOTIC DAILY) CAPS Take 1 capsule by mouth daily.    . sertraline (ZOLOFT) 100 MG tablet Take 1 tablet by mouth daily.    . vitamin E 400 UNIT capsule Take 400 Units by mouth daily.     No current facility-administered medications for this visit.    Allergies-reviewed and updated Allergies  Allergen Reactions  . Sulfa Antibiotics Hives and Other (See Comments)  . Sulfamethoxazole-Trimethoprim Hives, Other (See Comments) and Rash  . Septra [Bactrim] Hives    Social History   Socioeconomic History  . Marital status: Married    Spouse name: Not on file  . Number of children: Not on file  . Years of education: Not on file  . Highest education level: Not on file  Occupational History  . Not on file  Tobacco Use  . Smoking status: Never Smoker  . Smokeless tobacco: Never Used  Substance and Sexual Activity  . Alcohol use: No  . Drug use: Never  . Sexual activity: Yes  Other Topics Concern  . Not on file  Social History Narrative  . Not on file   Social Determinants of Health   Financial Resource  Strain: Not on file  Food Insecurity: Not on file  Transportation Needs: Not on file  Physical Activity: Not on file  Stress: Not on file  Social Connections: Not on file        Objective:  Physical Exam: BP 130/80   Pulse 72   Temp 98.4 F (36.9 C) (Temporal)   Ht 5\' 9"  (1.753 m)   Wt 178 lb 12.8 oz (81.1 kg)   SpO2 99%   BMI 26.40 kg/m   Body mass index is 26.4 kg/m. Wt Readings from Last 3 Encounters:  10/18/20 178 lb 12.8 oz (81.1 kg)  07/06/15 155 lb (70.3 kg)  03/29/13 155 lb 3.3 oz (70.4 kg)   Gen: NAD, resting comfortably HEENT: TMs normal bilaterally. OP clear. No thyromegaly noted.  CV: RRR with no murmurs appreciated Pulm: NWOB, CTAB with no crackles, wheezes, or rhonchi GI: Normal bowel sounds present. Soft, Nontender, Nondistended. MSK: no edema, cyanosis, or clubbing noted Skin: warm, dry Neuro: CN2-12 grossly intact. Strength 5/5 in  upper and lower extremities. Reflexes symmetric and intact bilaterally.  Psych: Normal affect and thought content     Bryan Alvarado M. Jimmey Ralph, MD 10/18/2020 11:31 AM

## 2020-10-18 NOTE — Assessment & Plan Note (Signed)
Stable. Continue Flonase 

## 2020-10-18 NOTE — Assessment & Plan Note (Signed)
Stable on omeprazole 20mg daily.  

## 2020-10-18 NOTE — Assessment & Plan Note (Signed)
Stable on Zoloft 100 mg daily. 

## 2020-10-19 NOTE — Progress Notes (Signed)
Please inform patient of the following:  Cholesterol levels are borderline elevated. Do not need to make any changes to his treatment plan at this time. WE can rececheck in a year or so.  Bryan Alvarado. Jimmey Ralph, MD 10/19/2020 12:53 PM

## 2020-10-31 ENCOUNTER — Other Ambulatory Visit: Payer: Self-pay

## 2020-10-31 ENCOUNTER — Ambulatory Visit: Payer: 59 | Admitting: Podiatry

## 2020-10-31 ENCOUNTER — Telehealth: Payer: Self-pay | Admitting: *Deleted

## 2020-10-31 ENCOUNTER — Encounter: Payer: Self-pay | Admitting: Podiatry

## 2020-10-31 DIAGNOSIS — B351 Tinea unguium: Secondary | ICD-10-CM | POA: Diagnosis not present

## 2020-10-31 DIAGNOSIS — L600 Ingrowing nail: Secondary | ICD-10-CM | POA: Diagnosis not present

## 2020-10-31 NOTE — Telephone Encounter (Signed)
Called and left Vmessage to call back concerning patient's  toe wrapping question.

## 2020-10-31 NOTE — Patient Instructions (Signed)

## 2020-10-31 NOTE — Progress Notes (Signed)
Subjective:   Patient ID: Bryan Alvarado, male   DOB: 50 y.o.   MRN: [id number]   HPI Patient presents stating he has had a lot of trouble with fungus of his toenails and his right big toenail is getting quite sore and making it hard for him to wear shoe gear.  Dates he tries to trim it and pad it without relief of symptoms and to become more of an issue and patient does not smoke likes to be active   Review of Systems  All other systems reviewed and are negative.       Objective:  Physical Exam Vitals and nursing note reviewed.  Constitutional:      Appearance: He is well-developed.  Pulmonary:     Effort: Pulmonary effort is normal.  Musculoskeletal:        General: Normal range of motion.  Skin:    General: Skin is warm.  Neurological:     Mental Status: He is alert.     Neurovascular muscle strength found to be adequate range of motion adequate.  Patient is found to have a thickened deformed right hallux nail that is painful when pressed and make shoe gear difficult and other nails that have discoloration and yellow-like appearance.  Patient has good digital perfusion well oriented x3     Assessment:  Severely thickened dystrophic right hallux nail that is painful dorsally along with probable mycotic nail infection     Plan:  H&P?  Condition he has done previous Lamisil treatment without success and does have a lot of pain in the right hallux nail and I do not recommend further oral antifungal or laser but I do think because of the pain the right hallux nail should be removed.  I explained procedure risk patient wants surgery understanding risk and signed consent form.  I infiltrated the right hallux 60 mg like Marcaine mixture sterile prep done and using sterile instrumentation I remove the hallux nail exposed the matrix applied phenol 3 applications 30 seconds followed by alcohol lavage sterile dressing and I gave instructions on soaks and to leave dressing on 24 hours  but take it off earlier if any throbbing were to occur.  Patient is encouraged to call with questions concerns which may arise

## 2020-10-31 NOTE — Telephone Encounter (Signed)
24 hours unless it starts to get throbby and then take off and start soaking

## 2020-10-31 NOTE — Telephone Encounter (Signed)
Patient is wanting to know how long should he keep wrapping on toe before removing and starting soaking? Please advise.

## 2020-10-31 NOTE — Telephone Encounter (Signed)
Called and informed patient per Dr Charlsie Merles, verbalized understanding and will call back if any further questions.

## 2021-03-11 ENCOUNTER — Ambulatory Visit: Payer: 59 | Admitting: Physician Assistant

## 2021-09-03 ENCOUNTER — Encounter: Payer: Self-pay | Admitting: Family Medicine

## 2021-09-04 NOTE — Telephone Encounter (Signed)
Patient has appointment with PCP tomorrow  ?

## 2021-09-05 ENCOUNTER — Other Ambulatory Visit (HOSPITAL_BASED_OUTPATIENT_CLINIC_OR_DEPARTMENT_OTHER): Payer: Self-pay

## 2021-09-05 ENCOUNTER — Encounter: Payer: Self-pay | Admitting: Family Medicine

## 2021-09-05 ENCOUNTER — Encounter (HOSPITAL_BASED_OUTPATIENT_CLINIC_OR_DEPARTMENT_OTHER): Payer: Self-pay

## 2021-09-05 ENCOUNTER — Ambulatory Visit: Payer: 59 | Admitting: Family Medicine

## 2021-09-05 ENCOUNTER — Other Ambulatory Visit: Payer: Self-pay

## 2021-09-05 VITALS — BP 131/83 | HR 77 | Temp 97.9°F | Ht 69.0 in | Wt 183.0 lb

## 2021-09-05 DIAGNOSIS — M1990 Unspecified osteoarthritis, unspecified site: Secondary | ICD-10-CM | POA: Diagnosis not present

## 2021-09-05 DIAGNOSIS — M2550 Pain in unspecified joint: Secondary | ICD-10-CM

## 2021-09-05 DIAGNOSIS — M545 Low back pain, unspecified: Secondary | ICD-10-CM

## 2021-09-05 DIAGNOSIS — F325 Major depressive disorder, single episode, in full remission: Secondary | ICD-10-CM

## 2021-09-05 LAB — COMPREHENSIVE METABOLIC PANEL
ALT: 19 U/L (ref 0–53)
AST: 21 U/L (ref 0–37)
Albumin: 4.1 g/dL (ref 3.5–5.2)
Alkaline Phosphatase: 74 U/L (ref 39–117)
BUN: 14 mg/dL (ref 6–23)
CO2: 28 mEq/L (ref 19–32)
Calcium: 9.3 mg/dL (ref 8.4–10.5)
Chloride: 104 mEq/L (ref 96–112)
Creatinine, Ser: 1.18 mg/dL (ref 0.40–1.50)
GFR: 72.27 mL/min (ref >60.00)
Glucose, Bld: 87 mg/dL (ref 70–99)
Potassium: 4.4 mEq/L (ref 3.5–5.1)
Sodium: 141 mEq/L (ref 135–145)
Total Bilirubin: 0.5 mg/dL (ref 0.2–1.2)
Total Protein: 6.5 g/dL (ref 6.0–8.3)

## 2021-09-05 LAB — CBC
HCT: 40.8 % (ref 39.0–52.0)
Hemoglobin: 13.3 g/dL (ref 13.0–17.0)
MCHC: 32.7 g/dL (ref 30.0–36.0)
MCV: 80.1 fl (ref 78.0–100.0)
Platelets: 359 10*3/uL (ref 150.0–400.0)
RBC: 5.1 Mil/uL (ref 4.22–5.81)
RDW: 15.4 % (ref 11.5–15.5)
WBC: 7.3 10*3/uL (ref 4.0–10.5)

## 2021-09-05 LAB — SEDIMENTATION RATE: Sed Rate: 9 mm/hr (ref 0–15)

## 2021-09-05 LAB — URIC ACID: Uric Acid, Serum: 7.8 mg/dL (ref 4.0–7.8)

## 2021-09-05 LAB — TSH: TSH: 2.55 u[IU]/mL (ref 0.35–5.50)

## 2021-09-05 LAB — C-REACTIVE PROTEIN: CRP: <1 mg/dL (ref 0.5–20.0)

## 2021-09-05 MED ORDER — DICLOFENAC SODIUM 2 % EX SOLN
CUTANEOUS | 0 refills | Status: AC
Start: 2021-09-05 — End: ?
  Filled 2021-09-05 – 2021-09-10 (×3): qty 112, 30d supply, fill #0

## 2021-09-05 NOTE — Assessment & Plan Note (Signed)
Possibly the source of his hand pain.  We will check plain films today confirm.  He can try Pennsaid.  May need referral to orthopedics or sports medicine.

## 2021-09-05 NOTE — Assessment & Plan Note (Signed)
Overall manageable.  Has a history of herniated disc.

## 2021-09-05 NOTE — Patient Instructions (Signed)
It was very nice to see you today!  It is possible that you  could have arthritis in your hands.  We will check blood work and x-ray to make sure there is nothing else going on.  Please take the Pennsaid as needed.  We may need to refer you to see orthopedics or sports medicine depending on the results.  Take care, Dr Jimmey Ralph  PLEASE NOTE:  If you had any lab tests please let us know if you have not heard back within a few days. You may see your results on mychart before we have a chance to review them but we will give you a call once they are reviewed by [place]. If we ordered any referrals today, please let us know if you have not heard from their office within the next week.   Please try these tips to maintain a healthy lifestyle:  Eat at least 3 REAL meals and 1-2 snacks per day.  Aim for no more than 5 hours between eating.  If you eat breakfast, please do so within one hour of getting up.   Each meal should contain half fruits/vegetables, one quarter protein, and one quarter carbs (no bigger than a computer mouse)  Cut down on sweet beverages. This includes juice, soda, and sweet tea.   Drink at least 1 glass of water with each meal and aim for at least 8 glasses per day  Exercise at least 150 minutes every week.

## 2021-09-05 NOTE — Assessment & Plan Note (Signed)
Recently stopped Zoloft.  He has a Teacher, music.  He is doing well off Zoloft.

## 2021-09-05 NOTE — Progress Notes (Signed)
° °  Bryan Alvarado is a 51 y.o. male who presents today for an office visit.  Assessment/Plan:  New/Acute Problems: Hand Pain Possibly OA.  No overt signs for inflammatory arthritis though will check labs and plain films today to further evaluate.  He can try Pennsaid.  If symptoms persist and labs are negative would consider referral to sports medicine or orthopedics.  Chronic Problems Addressed Today: Osteoarthritis Possibly the source of his hand pain.  We will check plain films today confirm.  He can try Pennsaid.  May need referral to orthopedics or sports medicine.  Lumbar back pain Overall manageable.  Has a history of herniated disc.  Major depression in remission [facility]) Recently stopped Zoloft.  He has a Teacher, music.  He is doing well off Zoloft.     Subjective:  HPI:  Patient here with bilateral hand pain for the past 3-6 months. Located on index fingers and knuckles. He notes doing several things make it worse. He tried Voltaren gel to help alleviate the symptoms. This has helped. Symptoms seems to be worsening. He notes right hand index finger is worse. Some swelling and soreness is present. He was diagnosed with arthritis in the past. No obvious precipitating events. No injury. Denies fever or chills. No rashes.   He was on Zoloft 100 mg daily in the past for depression. He notes he stopped taking this. He is feeling better since being off on it. He is following up with psychiatrist for this issue.        Objective:  Physical Exam: BP 131/83 (BP Location: Left Arm)    Pulse 77    Temp 97.9 F (36.6 C) (Temporal)    Ht 5\' 9"  (1.753 m)    Wt 183 lb (83 kg)    SpO2 98%    BMI 27.02 kg/m   Gen: No acute distress, resting comfortably MSK: Joint hypertrophy noted in bilateral hands, predominantly in right index finger.  Pain with axial loading.  Pain with palpation along PIP joint of right second digit.  No ulnar deviation noted.  No rashes. Neuro: Grossly normal, moves  all extremities Psych: Normal affect and thought content       I,Savera Zaman,acting as a scribe for Dimas Chyle, MD.,have documented all relevant documentation on the behalf of Dimas Chyle, MD,as directed by  Dimas Chyle, MD while in the presence of Dimas Chyle, MD.   I, Dimas Chyle, MD, have reviewed all documentation for this visit. The documentation on 09/05/21 for the exam, diagnosis, procedures, and orders are all accurate and complete.  Algis Greenhouse. Jerline Pain, MD 09/05/2021 9:56 AM

## 2021-09-06 ENCOUNTER — Other Ambulatory Visit (HOSPITAL_BASED_OUTPATIENT_CLINIC_OR_DEPARTMENT_OTHER): Payer: Self-pay

## 2021-09-06 ENCOUNTER — Encounter: Payer: Self-pay | Admitting: Family Medicine

## 2021-09-06 NOTE — Progress Notes (Signed)
Please inform patient of the following:  His labs are all NORMAL. No signs of inflammatory arthritis. We are still waiting on his xrays. We can refer him to see a sports medicine doctor for his hand pain if he wishes.  Bryan Alvarado. Jerline Pain, MD 09/06/2021 1:15 PM

## 2021-09-09 NOTE — Telephone Encounter (Signed)
Patient is requesting office to reach out to his pharmacy to find out what medication is covered.

## 2021-09-09 NOTE — Telephone Encounter (Signed)
Spoke with patient stated will call pharmacy and insurance for information

## 2021-09-09 NOTE — Telephone Encounter (Signed)
Please advise 

## 2021-09-10 ENCOUNTER — Encounter (HOSPITAL_BASED_OUTPATIENT_CLINIC_OR_DEPARTMENT_OTHER): Payer: Self-pay | Admitting: Pharmacist

## 2021-09-10 ENCOUNTER — Other Ambulatory Visit (HOSPITAL_BASED_OUTPATIENT_CLINIC_OR_DEPARTMENT_OTHER): Payer: Self-pay

## 2021-09-10 LAB — ANTI-NUCLEAR AB-TITER (ANA TITER): ANA Titer 1: 1:40 {titer} — ABNORMAL HIGH

## 2021-09-10 LAB — ANA: Anti Nuclear Antibody (ANA): POSITIVE — AB

## 2021-09-10 LAB — RHEUMATOID FACTOR: Rheumatoid fact SerPl-aCnc: <14 IU/mL (ref <14)

## 2021-09-10 NOTE — Telephone Encounter (Signed)
HE can try OTC voltaren gel if they dont pay for it.  Katina Degree. Jimmey Ralph, MD 09/10/2021 4:14 PM

## 2021-09-10 NOTE — Progress Notes (Signed)
Please inform patient of the following:  One of his screening tests for inflammatory arthritis was mildly positive. AS we discussed before we can have him see a specialist if he is interested.  Katina Degree. Jimmey Ralph, MD 09/10/2021 4:47 PM

## 2021-09-11 ENCOUNTER — Other Ambulatory Visit (HOSPITAL_BASED_OUTPATIENT_CLINIC_OR_DEPARTMENT_OTHER): Payer: Self-pay

## 2021-09-12 NOTE — Progress Notes (Signed)
Please place referral to rheumatology.  Algis Greenhouse. Jerline Pain, MD 09/12/2021 9:58 AM

## 2021-09-17 ENCOUNTER — Other Ambulatory Visit: Payer: Self-pay | Admitting: *Deleted

## 2021-09-17 DIAGNOSIS — M1990 Unspecified osteoarthritis, unspecified site: Secondary | ICD-10-CM

## 2021-10-21 ENCOUNTER — Encounter: Payer: 59 | Admitting: Family Medicine

## 2021-11-06 ENCOUNTER — Encounter: Payer: 59 | Admitting: Family Medicine

## 2021-11-17 NOTE — Progress Notes (Signed)
? ?Office Visit Note ? ?Patient: Bryan Alvarado             ?Date of Birth: [birth year]           ?MRN: [id number]             ?PCP: Ardith Dark, MD ?Referring: Ardith Dark, MD ?Visit Date: 11/18/2021 ?Occupation: Nutritional therapist ? ?Subjective:  ?New Patient (Initial Visit) (Abnormal labs) ? ? ?History of Present Illness: Bryan Alvarado is a 51 y.o. male here for evaluation of bilateral hand pain and positive ANA Abs. Labs were otherwise normal for inflammatory markers or RA antibodies. He has a history of cystic fibrosis also chronic pancreatitis. He has hand pain and stiffness for about 5 years duration no specific or acute onset.  Symptoms bother him frequently while working on computer.  He noticed an increase in symptoms especially during the past year and somewhat worse during colder weather.  Besides hand pain he has some chronic pain in the low back and bilateral knees.  He does know of a bulging disc that could also be contributing to the low back pain.  He has had recurrent dislocations of the right shoulder with repeated injuries from sports including football hockey and rockclimbing.  He has tried topical diclofenac which is partially helpful for joint pain.  Wearing compressive gloves overnight has helped with morning swelling in proximal index finger joints.  Tried modification with a different computer mouse was also partially helpful.  He has had a considerable weight gain with starting medication for his chronic pancreatitis this then stabilized and the diarrhea is much better as well.  He denies any new skin rashes or changes.  No discoloration of his fingers with cold exposure.  He has not gotten around to getting the ordered hand x-rays from his primary care clinic, he had some previous imaging but was years ago. ? ?Activities of Daily Living:  ?Patient reports morning stiffness for 2 hours.   ?Patient Denies nocturnal pain.  ?Difficulty dressing/grooming: Denies ?Difficulty climbing  stairs: Denies ?Difficulty getting out of chair: Denies ?Difficulty using hands for taps, buttons, cutlery, and/or writing: Denies ? ?Review of Systems  ?Constitutional:  Positive for fatigue.  ?HENT:  Negative for mouth dryness.   ?Eyes:  Negative for dryness.  ?Respiratory:  Negative for shortness of breath.   ?Cardiovascular:  Negative for swelling in legs/feet.  ?Gastrointestinal:  Positive for constipation and diarrhea.  ?Endocrine: Negative for increased urination.  ?Genitourinary:  Negative for difficulty urinating.  ?Musculoskeletal:  Positive for joint pain, joint pain, joint swelling and morning stiffness.  ?Skin:  Negative for rash.  ?Allergic/Immunologic: Positive for susceptible to infections.  ?Neurological:  Negative for numbness.  ?Hematological:  Negative for bruising/bleeding tendency.  ?Psychiatric/Behavioral:  Negative for sleep disturbance.   ? ?PMFS History:  ?Patient Active Problem List  ? Diagnosis Date Noted  ? Chronic relapsing pancreatitis (HCC) 12/04/2021  ? Family history of colon cancer 11/18/2021  ? Positive ANA (antinuclear antibody) 11/18/2021  ? Lumbar back pain 09/05/2021  ? Osteoarthritis 09/05/2021  ? GERD (gastroesophageal reflux disease) 10/18/2020  ? Major depression in remission (HCC) 10/18/2020  ? Onychomycosis 10/18/2020  ? Generalized anxiety disorder 06/09/2019  ? 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  ? Pain in both hands 10/30/2016  ? 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 Allergic rhinitis due to pollen 02/22/2015  ? Candidal stomatitis 12/05/2013  ? Cystic fibrosis (HCC) 06/12/2012  ? Chronic sinusitis 06/12/2012  ? Allergic  rhinitis 06/12/2012  ? History of chronic pancreatitis 08/04/2008  ?  ?Past Medical History:  ?Diagnosis Date  ? Cystic fibrosis   ? Depression   ? Pancreatic cystic fibrosis (HCC)   ? Sinusitis   ?  ?Family History  ?Problem Relation Age of Onset  ? Cancer Mother   ? Hyperlipidemia Father   ? Hypertension Father   ? Cancer  Father   ? Arthritis Father   ? Arthritis Sister   ? Hyperlipidemia Brother   ? ?Past Surgical History:  ?Procedure Laterality Date  ? CHOLECYSTECTOMY    ? FRONTAL SINUSOTOMY    ? pancreatic stent    ? ?Social History  ? ?Social History Narrative  ? Not on file  ? ?Immunization History  ?Administered Date(s) Administered  ? Influenza Split 05/12/2008, 08/01/2010, 05/08/2011  ? Influenza, Seasonal, Injecte, Preservative Fre 05/12/2008, 08/01/2010, 05/08/2011, 04/26/2015  ? Influenza,inj,Quad PF,6+ Mos 04/26/2015  ? Influenza,inj,quad, With Preservative 05/11/2013, 04/26/2015, 05/13/2016, 04/04/2020, 05/07/2021  ? Influenza-Unspecified 05/11/2013, 04/27/2014, 04/26/2015, 05/13/2016, 04/29/2017, 05/20/2017, 05/18/2018, 05/04/2019  ? Moderna Covid-19 Vaccine Bivalent Booster 4997yrs & up 05/07/2021  ? Moderna Sars-Covid-2 Vaccination 09/26/2019, 11/01/2019  ? PFIZER(Purple Top)SARS-COV-2 Vaccination 07/05/2020  ? Tdap 06/07/2011, 12/04/2021  ?  ? ?Objective: ?Vital Signs: BP 134/83 (BP Location: Right Arm, Patient Position: Sitting, Cuff Size: Normal)   Pulse 72   Resp 15   Ht 5' 8.5" (1.74 m)   Wt 183 lb (83 kg)   BMI 27.42 kg/m?   ? ?Physical Exam ?Eyes:  ?   Conjunctiva/sclera: Conjunctivae normal.  ?Cardiovascular:  ?   Rate and Rhythm: Normal rate and regular rhythm.  ?Pulmonary:  ?   Effort: Pulmonary effort is normal.  ?   Breath sounds: Normal breath sounds.  ?Musculoskeletal:  ?   Right lower leg: No edema.  ?   Left lower leg: No edema.  ?Lymphadenopathy:  ?   Cervical: No cervical adenopathy.  ?Skin: ?   General: Skin is warm and dry.  ?   Findings: No rash.  ?Neurological:  ?   Mental Status: He is alert.  ?Psychiatric:     ?   Mood and Affect: Mood normal.  ?  ? ?Musculoskeletal Exam:  ?Shoulders full ROM no tenderness or swelling ?Elbows full ROM no tenderness or swelling ?Wrists full ROM no tenderness or swelling ?Fingers full ROM no tenderness or swelling ?No paraspinal tenderness to palpation over  lower back ?Hip normal internal and external rotation without pain, no tenderness to lateral hip palpation, mild hip pain with FADIR and FABER maneuver ?Knees full ROM no tenderness or swelling ?Ankles full ROM no tenderness or swelling ? ? ?Investigation: ?No additional findings. ? ?Imaging: ?XR Hand 2 View Left ? ?Result Date: 12/05/2021 ?X-ray left hand 2 views Radiocarpal and carpal joint spaces appear normal.  MCP joint spaces appear normal.  PIP and DIP joint spaces are well-preserved.  Bone mineralization appears normal.  No abnormal calcifications and no erosions seen. Impression Normal appearing hand x-ray ? ?XR Hand 2 View Right ? ?Result Date: 12/05/2021 ?X-ray right hand 2 views Radiocarpal and carpal joint spaces appear normal.  MCP joint spaces appear normal.  Possible early lateral osteophyte at second MCP joint.  PIP and joint joint spaces appear normal.  Bone mineralization appears normal no calcifications or erosions seen. Impression Normal hand xray  ? ?Recent Labs: ?Lab Results  ?Component Value Date  ? WBC 7.5 12/04/2021  ? HGB 13.2 12/04/2021  ? PLT 395.0 12/04/2021  ?  NA 140 12/04/2021  ? K 3.9 12/04/2021  ? CL 105 12/04/2021  ? CO2 28 12/04/2021  ? GLUCOSE 93 12/04/2021  ? BUN 16 12/04/2021  ? CREATININE 1.29 12/04/2021  ? BILITOT 0.5 12/04/2021  ? ALKPHOS 65 12/04/2021  ? AST 19 12/04/2021  ? ALT 17 12/04/2021  ? PROT 6.4 12/04/2021  ? ALBUMIN 4.0 12/04/2021  ? CALCIUM 8.9 12/04/2021  ? GFRAA >90 03/29/2013  ? ? ?Speciality Comments: No specialty comments available. ? ?Procedures:  ?No procedures performed ?Allergies: Sulfa antibiotics, Sulfamethoxazole-trimethoprim, and Septra [bactrim]  ? ?Assessment / Plan:     ?Visit Diagnoses: Positive ANA (antinuclear antibody) - Plan: RNP Antibody, Anti-Smith antibody, Sjogrens syndrome-A extractable nuclear antibody, Anti-DNA antibody, double-stranded ? ?Positive ANA without specific clinical criteria and symptoms on exam or history at this time.  Checking more specific antibody markers today. Some of his known joint issues are chronic degenerative or injury related. Lower pretest suspicion for disease. ? ?Osteoarthritis, unspecified osteoarthritis type, unsp

## 2021-11-18 ENCOUNTER — Ambulatory Visit (INDEPENDENT_AMBULATORY_CARE_PROVIDER_SITE_OTHER): Payer: 59

## 2021-11-18 ENCOUNTER — Ambulatory Visit: Payer: Self-pay

## 2021-11-18 ENCOUNTER — Encounter: Payer: Self-pay | Admitting: Internal Medicine

## 2021-11-18 ENCOUNTER — Ambulatory Visit: Payer: 59 | Admitting: Internal Medicine

## 2021-11-18 VITALS — BP 134/83 | HR 72 | Resp 15 | Ht 68.5 in | Wt 183.0 lb

## 2021-11-18 DIAGNOSIS — M1990 Unspecified osteoarthritis, unspecified site: Secondary | ICD-10-CM

## 2021-11-18 DIAGNOSIS — M545 Low back pain, unspecified: Secondary | ICD-10-CM

## 2021-11-18 DIAGNOSIS — M19041 Primary osteoarthritis, right hand: Secondary | ICD-10-CM

## 2021-11-18 DIAGNOSIS — R768 Other specified abnormal immunological findings in serum: Secondary | ICD-10-CM | POA: Diagnosis not present

## 2021-11-18 DIAGNOSIS — M19042 Primary osteoarthritis, left hand: Secondary | ICD-10-CM

## 2021-11-18 DIAGNOSIS — Z800 Family history of malignant neoplasm of digestive organs: Secondary | ICD-10-CM | POA: Insufficient documentation

## 2021-11-18 DIAGNOSIS — R7689 Other specified abnormal immunological findings in serum: Secondary | ICD-10-CM

## 2021-11-18 DIAGNOSIS — R1011 Right upper quadrant pain: Secondary | ICD-10-CM | POA: Insufficient documentation

## 2021-11-19 LAB — RNP ANTIBODY: Ribonucleic Protein(ENA) Antibody, IgG: <1 AI

## 2021-11-19 LAB — ANTI-DNA ANTIBODY, DOUBLE-STRANDED: ds DNA Ab: 4 IU/mL

## 2021-11-19 LAB — SJOGRENS SYNDROME-A EXTRACTABLE NUCLEAR ANTIBODY: SSA (Ro) (ENA) Antibody, IgG: <1 AI

## 2021-11-19 LAB — ANTI-SMITH ANTIBODY: ENA SM Ab Ser-aCnc: <1 AI

## 2021-12-04 ENCOUNTER — Encounter: Payer: Self-pay | Admitting: Internal Medicine

## 2021-12-04 ENCOUNTER — Ambulatory Visit (INDEPENDENT_AMBULATORY_CARE_PROVIDER_SITE_OTHER): Payer: 59 | Admitting: Family Medicine

## 2021-12-04 VITALS — BP 127/84 | HR 78 | Temp 98.2°F | Ht 68.5 in | Wt 185.4 lb

## 2021-12-04 DIAGNOSIS — K861 Other chronic pancreatitis: Secondary | ICD-10-CM

## 2021-12-04 DIAGNOSIS — M19041 Primary osteoarthritis, right hand: Secondary | ICD-10-CM

## 2021-12-04 DIAGNOSIS — E785 Hyperlipidemia, unspecified: Secondary | ICD-10-CM

## 2021-12-04 DIAGNOSIS — Z0001 Encounter for general adult medical examination with abnormal findings: Secondary | ICD-10-CM

## 2021-12-04 DIAGNOSIS — Z23 Encounter for immunization: Secondary | ICD-10-CM | POA: Diagnosis not present

## 2021-12-04 DIAGNOSIS — F411 Generalized anxiety disorder: Secondary | ICD-10-CM | POA: Diagnosis not present

## 2021-12-04 DIAGNOSIS — Z13220 Encounter for screening for lipoid disorders: Secondary | ICD-10-CM

## 2021-12-04 DIAGNOSIS — F325 Major depressive disorder, single episode, in full remission: Secondary | ICD-10-CM

## 2021-12-04 DIAGNOSIS — M19042 Primary osteoarthritis, left hand: Secondary | ICD-10-CM

## 2021-12-04 DIAGNOSIS — Z131 Encounter for screening for diabetes mellitus: Secondary | ICD-10-CM

## 2021-12-04 DIAGNOSIS — F320 Major depressive disorder, single episode, mild: Secondary | ICD-10-CM

## 2021-12-04 LAB — COMPREHENSIVE METABOLIC PANEL
ALT: 17 U/L (ref 0–53)
AST: 19 U/L (ref 0–37)
Albumin: 4 g/dL (ref 3.5–5.2)
Alkaline Phosphatase: 65 U/L (ref 39–117)
BUN: 16 mg/dL (ref 6–23)
CO2: 28 mEq/L (ref 19–32)
Calcium: 8.9 mg/dL (ref 8.4–10.5)
Chloride: 105 mEq/L (ref 96–112)
Creatinine, Ser: 1.29 mg/dL (ref 0.40–1.50)
GFR: 64.83 mL/min (ref >60.00)
Glucose, Bld: 93 mg/dL (ref 70–99)
Potassium: 3.9 mEq/L (ref 3.5–5.1)
Sodium: 140 mEq/L (ref 135–145)
Total Bilirubin: 0.5 mg/dL (ref 0.2–1.2)
Total Protein: 6.4 g/dL (ref 6.0–8.3)

## 2021-12-04 LAB — LIPID PANEL
Cholesterol: 216 mg/dL — ABNORMAL HIGH (ref 0–200)
HDL: 58.2 mg/dL (ref >39.00)
LDL Cholesterol: 137 mg/dL — ABNORMAL HIGH (ref 0–99)
NonHDL: 157.84
Total CHOL/HDL Ratio: 4
Triglycerides: 103 mg/dL (ref 0.0–149.0)
VLDL: 20.6 mg/dL (ref 0.0–40.0)

## 2021-12-04 LAB — TSH: TSH: 2.02 u[IU]/mL (ref 0.35–5.50)

## 2021-12-04 LAB — CBC
HCT: 39.5 % (ref 39.0–52.0)
Hemoglobin: 13.2 g/dL (ref 13.0–17.0)
MCHC: 33.5 g/dL (ref 30.0–36.0)
MCV: 78.9 fl (ref 78.0–100.0)
Platelets: 395 10*3/uL (ref 150.0–400.0)
RBC: 5.01 Mil/uL (ref 4.22–5.81)
RDW: 14.9 % (ref 11.5–15.5)
WBC: 7.5 10*3/uL (ref 4.0–10.5)

## 2021-12-04 LAB — HEMOGLOBIN A1C: Hgb A1c MFr Bld: 5.8 % (ref 4.6–6.5)

## 2021-12-04 NOTE — Assessment & Plan Note (Signed)
He recently saw rheumatology for this.  Had labs done but he has not yet heard back from rheumatologist.  Symptoms are overall manageable at this point.  Advised him to contact rheumatology soon to discuss neck steps. ?

## 2021-12-04 NOTE — Progress Notes (Signed)
? ?Chief Complaint:  ?Bryan Alvarado is a 51 y.o. male who presents today for his annual comprehensive physical exam.   ? 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 ? ?Chronic relapsing pancreatitis (HCC) ?Overall stable.  Increased risk for diabetes at some point his life.  We will check A1c today. ? ?Generalized anxiety disorder ?Follows with psychiatry.  Recently stopped Zoloft.  Symptoms have been well controlled off medications. ? ?Arthritis of finger of both hands ?He recently saw rheumatology for this.  Had labs done but he has not yet heard back from rheumatologist.  Symptoms are overall manageable at this point.  Advised him to contact rheumatology soon to discuss neck steps. ? ?Major depression in remission [facility]) ?Stable off Zoloft. ? ?Preventative Healthcare: ?Tdap given today.  Check labs.  He will come back for shingles vaccine.  Up-to-date on colon cancer screening. ? ?Patient Counseling(The following topics were reviewed and/or handout was given): ? -Nutrition: Stressed importance of moderation in sodium/caffeine intake, saturated fat and cholesterol, caloric balance, sufficient intake of fresh fruits, vegetables, and fiber. ? -Stressed the importance of regular exercise.  ? -Substance Abuse: Discussed cessation/primary prevention of tobacco, alcohol, or other drug use; driving or other dangerous activities under the influence; availability of treatment for abuse.  ? -Injury prevention: Discussed safety belts, safety helmets, smoke detector, smoking near bedding or upholstery.  ? -Sexuality: Discussed sexually transmitted diseases, partner selection, use of condoms, avoidance of unintended pregnancy and contraceptive alternatives.  ? -Dental health: Discussed importance of regular tooth brushing, flossing, and dental visits. ? -Health maintenance and immunizations reviewed. Please refer to Health maintenance  section. ? ?Return to care in 1 year for next preventative visit.  ? ?  ?Subjective:  ?HPI: ? ?He has no acute complaints today.  ? ?Lifestyle ?Diet: Cutting down on sugar.  ?Exercise: Limited but wants to get back into rowing.  ? ? ?  12/04/2021  ?  8:04 AM  ?Depression screen PHQ 2/9  ?Decreased Interest 0  ?Down, Depressed, Hopeless 0  ?PHQ - 2 Score 0  ?Altered sleeping 0  ?Tired, decreased energy 0  ?Change in appetite 0  ?Feeling bad or failure about yourself  0  ?Trouble concentrating 0  ?Moving slowly or fidgety/restless 0  ?Suicidal thoughts 0  ?PHQ-9 Score 0  ? ? ?Health Maintenance Due  ?Topic Date Due  ? HIV Screening  Never done  ? Hepatitis C Screening  Never done  ? Zoster Vaccines- Shingrix (1 of 2) Never done  ? TETANUS/TDAP  06/06/2021  ?  ? ?ROS: Per HPI, otherwise a complete review of systems was negative.  ? ?PMH: ? ?The following were reviewed and entered/updated in epic: ?Past Medical History:  ?Diagnosis Date  ? Cystic fibrosis   ? Depression   ? Pancreatic cystic fibrosis (HCC)   ? Sinusitis   ? ?Patient Active Problem List  ? Diagnosis Date Noted  ? Chronic relapsing pancreatitis (HCC) 12/04/2021  ? Family history of colon cancer 11/18/2021  ? Positive ANA (antinuclear antibody) 11/18/2021  ? Lumbar back pain 09/05/2021  ? Osteoarthritis 09/05/2021  ? GERD (gastroesophageal reflux disease) 10/18/2020  ? Major depression in remission (HCC) 10/18/2020  ? Onychomycosis 10/18/2020  ? Generalized anxiety disorder 06/09/2019  ? 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  ? Pain in both hands 10/30/2016  ? 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 Allergic rhinitis due to pollen 02/22/2015  ? Candidal stomatitis 12/05/2013  ?  Cystic fibrosis (HCC) 06/12/2012  ? Chronic sinusitis 06/12/2012  ? Allergic rhinitis 06/12/2012  ? History of chronic pancreatitis 08/04/2008  ? ?Past Surgical History:  ?Procedure Laterality Date  ? CHOLECYSTECTOMY    ? FRONTAL SINUSOTOMY    ? pancreatic stent     ? ? ?Family History  ?Problem Relation Age of Onset  ? Cancer Mother   ? Hyperlipidemia Father   ? Hypertension Father   ? Cancer Father   ? Arthritis Father   ? Arthritis Sister   ? Hyperlipidemia Brother   ? ? ?Medications- reviewed and updated ?Current Outpatient Medications  ?Medication Sig Dispense Refill  ? acetaminophen (TYLENOL) 500 MG tablet Take by mouth.    ? albuterol (VENTOLIN HFA) 108 (90 Base) MCG/ACT inhaler Inhale 2 puffs into the lungs 2 (two) times daily as needed for wheezing or shortness of breath.    ? calcium carbonate (OS-CAL) 1250 (500 Ca) MG chewable tablet Chew by mouth.    ? cetirizine (ZYRTEC) 10 MG tablet Take 10 mg by mouth daily.     ? cholecalciferol (VITAMIN D) 1000 UNITS tablet Take 1,000 Units by mouth daily.    ? Diclofenac Sodium (PENNSAID) 2 % SOLN Apply 2-4 times daily as needed. 112 g 0  ? Elexacaf-Tezacaf-Ivacaf&Ivacaf (TRIKAFTA) 100-50-75 & 150 MG TBPK Take 2 Tablets (Elexacaftor 100mg /Tezacaftor 50mg /Ivacaftor 75mg ) by mouth in the AM and 1 tablet (ivacaftor 150mg ) in the PM w/fatty food    ? fluticasone (FLONASE) 50 MCG/ACT nasal spray Place 2 sprays into the nose daily.    ? Fluticasone-Salmeterol (ADVAIR) 500-50 MCG/DOSE AEPB Inhale 1 puff into the lungs every 12 (twelve) hours.    ? Hypertonic Nasal Wash (SINUS RINSE REFILL) PACK 0.5 Bottles.    ? ibuprofen (ADVIL) 200 MG tablet Take by mouth.    ? Multiple Vitamin (MULTI-VITAMIN) tablet Take 1 tablet by mouth daily.    ? omeprazole (PRILOSEC) 20 MG capsule Take 20 mg by mouth every morning.    ? ondansetron (ZOFRAN-ODT) 4 MG disintegrating tablet Take by mouth.    ? Sodium Chloride, Inhalant, 7 % NEBU Inhale into the lungs.    ? ?No current facility-administered medications for this visit.  ? ? ?Allergies-reviewed and updated ?Allergies  ?Allergen Reactions  ? Sulfa Antibiotics Hives and Other (See Comments)  ? Sulfamethoxazole-Trimethoprim Hives, Other (See Comments) and Rash  ? Septra [Bactrim] Hives  ? ? ?Social  History  ? ?Socioeconomic History  ? Marital status: Married  ?  Spouse name: Not on file  ? Number of children: Not on file  ? Years of education: Not on file  ? Highest education level: Not on file  ?Occupational History  ? Not on file  ?Tobacco Use  ? Smoking status: Never  ? Smokeless tobacco: Never  ?Vaping Use  ? Vaping Use: Never used  ?Substance and Sexual Activity  ? Alcohol use: No  ? Drug use: Never  ? Sexual activity: Yes  ?Other Topics Concern  ? Not on file  ?Social History Narrative  ? Not on file  ? ?Social Determinants of Health  ? ?Financial Resource Strain: Not on file  ?Food Insecurity: Not on file  ?Transportation Needs: Not on file  ?Physical Activity: Not on file  ?Stress: Not on file  ?Social Connections: Not on file  ? ?   ?  ?Objective:  ?Physical Exam: ?BP 127/84   Pulse 78   Temp 98.2 ?F (36.8 ?C) (Temporal)   Ht 5' 8.5" (1.74 m)  Wt 185 lb 6.4 oz (84.1 kg)   SpO2 98%   BMI 27.78 kg/m?   ?Body mass index is 27.78 kg/m?. ?Wt Readings from Last 3 Encounters:  ?12/04/21 185 lb 6.4 oz (84.1 kg)  ?11/18/21 183 lb (83 kg)  ?09/05/21 183 lb (83 kg)  ? ?Gen: NAD, resting comfortably ?HEENT: TMs normal bilaterally. OP clear. No thyromegaly noted.  ?CV: RRR with no murmurs appreciated ?Pulm: NWOB, CTAB with no crackles, wheezes, or rhonchi ?GI: Normal bowel sounds present. Soft, Nontender, Nondistended. ?MSK: no edema, cyanosis, or clubbing noted ?Skin: warm, dry ?Neuro: CN2-12 grossly intact. Strength 5/5 in upper and lower extremities. Reflexes symmetric and intact bilaterally.  ?Psych: Normal affect and thought content ?   ? ?Katina Degreealeb M. Jimmey RalphParker, MD ?12/04/2021 8:25 AM  ?

## 2021-12-04 NOTE — Assessment & Plan Note (Signed)
Follows with psychiatry.  Recently stopped Zoloft.  Symptoms have been well controlled off medications. ?

## 2021-12-04 NOTE — Assessment & Plan Note (Signed)
Continue management per pulmonology at [facility]. ?

## 2021-12-04 NOTE — Assessment & Plan Note (Signed)
Overall stable.  Increased risk for diabetes at some point his life.  We will check A1c today. ?

## 2021-12-04 NOTE — Patient Instructions (Signed)
It was very nice to see you today! ? ?We we will check blood work today ? ?We will give your tetanus vaccine today. ? ?Please continue to work on diet exercise. ? ?We will see back in a year for your next physical.  Come back sooner if needed. ? ?Take care, ?Dr Jimmey Ralph ? ?PLEASE NOTE: ? ?If you had any lab tests please let us know if you have not heard back within a few days. You may see your results on mychart before we have a chance to review them but we will give you a call once they are reviewed by [place]. If we ordered any referrals today, please let us know if you have not heard from their office within the next week.  ? ?Please try these tips to maintain a healthy lifestyle: ? ?Eat at least 3 REAL meals and 1-2 snacks per day.  Aim for no more than 5 hours between eating.  If you eat breakfast, please do so within one hour of getting up.  ? ?Each meal should contain half fruits/vegetables, one quarter protein, and one quarter carbs (no bigger than a computer mouse) ? ?Cut down on sweet beverages. This includes juice, soda, and sweet tea.  ? ?Drink at least 1 glass of water with each meal and aim for at least 8 glasses per day ? ?Exercise at least 150 minutes every week.   ? ?Preventive Care 75-71 Years Old, Male ?Preventive care refers to lifestyle choices and visits with your health care provider that can promote health and wellness. Preventive care visits are also called wellness exams. ?What can I expect for my preventive care visit? ?Counseling ?During your preventive care visit, your health care provider may ask about your: ?Medical history, including: ?Past medical problems. ?Family medical history. ?Current health, including: ?Emotional well-being. ?Home life and relationship well-being. ?Sexual activity. ?Lifestyle, including: ?Alcohol, nicotine or tobacco, and drug use. ?Access to firearms. ?Diet, exercise, and sleep habits. ?Safety issues such as seatbelt and bike helmet use. ?Sunscreen use. ?Work and  work Astronomer. ?Physical exam ?Your health care provider will check your: ?Height and weight. These may be used to calculate your BMI (body mass index). BMI is a measurement that tells if you are at a healthy weight. ?Waist circumference. This measures the distance around your waistline. This measurement also tells if you are at a healthy weight and may help predict your risk of certain diseases, such as type 2 diabetes and high blood pressure. ?Heart rate and blood pressure. ?Body temperature. ?Skin for abnormal spots. ?What immunizations do I need? ? ?Vaccines are usually given at various ages, according to a schedule. Your health care provider will recommend vaccines for you based on your age, medical history, and lifestyle or other factors, such as travel or where you work. ?What tests do I need? ?Screening ?Your health care provider may recommend screening tests for certain conditions. This may include: ?Lipid and cholesterol levels. ?Diabetes screening. This is done by checking your blood sugar (glucose) after you have not eaten for a while (fasting). ?Hepatitis B test. ?Hepatitis C test. ?HIV (human immunodeficiency virus) test. ?STI (sexually transmitted infection) testing, if you are at risk. ?Lung cancer screening. ?Prostate cancer screening. ?Colorectal cancer screening. ?Talk with your health care provider about your test results, treatment options, and if necessary, the need for more tests. ?Follow these instructions at home: ?Eating and drinking ? ?Eat a diet that includes fresh fruits and vegetables, whole grains, lean protein, and low-fat dairy  products. ?Take vitamin and mineral supplements as recommended by your health care provider. ?Do not drink alcohol if your health care provider tells you not to drink. ?If you drink alcohol: ?Limit how much you have to 0-2 drinks a day. ?Know how much alcohol is in your drink. In the [place] one drink equals one 12 oz bottle of beer (355 mL), one 5 oz glass  of wine (148 mL), or one 1? oz glass of hard liquor (44 mL). ?Lifestyle ?Brush your teeth every morning and night with fluoride toothpaste. Floss one time each day. ?Exercise for at least 30 minutes 5 or more days each week. ?Do not use any products that contain nicotine or tobacco. These products include cigarettes, chewing tobacco, and vaping devices, such as e-cigarettes. If you need help quitting, ask your health care provider. ?Do not use drugs. ?If you are sexually active, practice safe sex. Use a condom or other form of protection to prevent STIs. ?Take aspirin only as told by your health care provider. Make sure that you understand how much to take and what form to take. Work with your health care provider to find out whether it is safe and beneficial for you to take aspirin daily. ?Find healthy ways to manage stress, such as: ?Meditation, yoga, or listening to music. ?Journaling. ?Talking to a trusted person. ?Spending time with friends and family. ?Minimize exposure to UV radiation to reduce your risk of skin cancer. ?Safety ?Always wear your seat belt while driving or riding in a vehicle. ?Do not drive: ?If you have been drinking alcohol. Do not ride with someone who has been drinking. ?When you are tired or distracted. ?While texting. ?If you have been using any mind-altering substances or drugs. ?Wear a helmet and other protective equipment during sports activities. ?If you have firearms in your house, make sure you follow all gun safety procedures. ?What's next? ?Go to your health care provider once a year for an annual wellness visit. ?Ask your health care provider how often you should have your eyes and teeth checked. ?Stay up to date on all vaccines. ?This information is not intended to replace advice given to you by your health care provider. Make sure you discuss any questions you have with your health care provider. ?Document Revised: 01/16/2021 Document Reviewed: 01/16/2021 ?Elsevier Patient  Education ? 2023 Elsevier Inc. ? ?

## 2021-12-04 NOTE — Assessment & Plan Note (Signed)
Stable off Zoloft. ?

## 2021-12-05 NOTE — Progress Notes (Signed)
Please inform patient of the following: ? ?His cholesterol and blood sugar are both in the borderline range but stable.  No need to make any changes to treatment plan at this time.  He should continue to work on diet and exercise and we can recheck in a year.

## 2022-01-14 ENCOUNTER — Ambulatory Visit (INDEPENDENT_AMBULATORY_CARE_PROVIDER_SITE_OTHER): Payer: 59

## 2022-01-14 DIAGNOSIS — Z23 Encounter for immunization: Secondary | ICD-10-CM

## 2022-02-05 ENCOUNTER — Other Ambulatory Visit (HOSPITAL_BASED_OUTPATIENT_CLINIC_OR_DEPARTMENT_OTHER): Payer: Self-pay

## 2022-02-05 MED ORDER — TRIAMCINOLONE ACETONIDE 0.1 % MT PSTE
PASTE | OROMUCOSAL | 99 refills | Status: DC
  Filled 2022-02-05: qty 5, 14d supply, fill #0

## 2022-04-15 ENCOUNTER — Ambulatory Visit: Payer: 59

## 2022-04-24 ENCOUNTER — Ambulatory Visit (INDEPENDENT_AMBULATORY_CARE_PROVIDER_SITE_OTHER): Payer: 59 | Admitting: *Deleted

## 2022-04-24 DIAGNOSIS — Z23 Encounter for immunization: Secondary | ICD-10-CM | POA: Diagnosis not present

## 2022-04-24 NOTE — Progress Notes (Signed)
I have reviewed the patient's encounter and agree with the documentation.  Algis Greenhouse. Jerline Pain, MD 04/24/2022 11:03 AM

## 2022-04-24 NOTE — Progress Notes (Signed)
Per orders of Dr. Jerline Pain, injection of Shingrix 0.5 ml given IM by Anselmo Pickler, LPN in right deltoid. Patient tolerated injection well. Pt has completed the series.

## 2022-04-28 ENCOUNTER — Encounter: Payer: Self-pay | Admitting: *Deleted

## 2022-05-13 ENCOUNTER — Ambulatory Visit: Payer: 59

## 2022-05-15 ENCOUNTER — Ambulatory Visit: Payer: 59

## 2022-05-20 ENCOUNTER — Ambulatory Visit (INDEPENDENT_AMBULATORY_CARE_PROVIDER_SITE_OTHER): Payer: 59

## 2022-05-20 DIAGNOSIS — Z23 Encounter for immunization: Secondary | ICD-10-CM | POA: Diagnosis not present

## 2022-12-09 ENCOUNTER — Encounter: Payer: 59 | Admitting: Family Medicine

## 2023-01-28 ENCOUNTER — Encounter: Payer: Self-pay | Admitting: Family Medicine

## 2023-01-28 ENCOUNTER — Ambulatory Visit (INDEPENDENT_AMBULATORY_CARE_PROVIDER_SITE_OTHER): Payer: 59 | Admitting: Family Medicine

## 2023-01-28 VITALS — BP 128/68 | HR 67 | Ht 68.5 in | Wt 186.2 lb

## 2023-01-28 DIAGNOSIS — K219 Gastro-esophageal reflux disease without esophagitis: Secondary | ICD-10-CM | POA: Diagnosis not present

## 2023-01-28 DIAGNOSIS — F325 Major depressive disorder, single episode, in full remission: Secondary | ICD-10-CM

## 2023-01-28 DIAGNOSIS — Z0001 Encounter for general adult medical examination with abnormal findings: Secondary | ICD-10-CM | POA: Diagnosis not present

## 2023-01-28 DIAGNOSIS — R739 Hyperglycemia, unspecified: Secondary | ICD-10-CM

## 2023-01-28 DIAGNOSIS — E785 Hyperlipidemia, unspecified: Secondary | ICD-10-CM | POA: Diagnosis not present

## 2023-01-28 DIAGNOSIS — M1990 Unspecified osteoarthritis, unspecified site: Secondary | ICD-10-CM

## 2023-01-28 LAB — CBC
HCT: 39.5 % (ref 39.0–52.0)
Hemoglobin: 12.8 g/dL — ABNORMAL LOW (ref 13.0–17.0)
MCHC: 32.4 g/dL (ref 30.0–36.0)
MCV: 78.6 fl (ref 78.0–100.0)
Platelets: 343 10*3/uL (ref 150.0–400.0)
RBC: 5.02 Mil/uL (ref 4.22–5.81)
RDW: 16.2 % — ABNORMAL HIGH (ref 11.5–15.5)
WBC: 5.5 10*3/uL (ref 4.0–10.5)

## 2023-01-28 LAB — HEMOGLOBIN A1C: Hgb A1c MFr Bld: 5.6 % (ref 4.6–6.5)

## 2023-01-28 LAB — COMPREHENSIVE METABOLIC PANEL
ALT: 12 U/L (ref 0–53)
AST: 17 U/L (ref 0–37)
Albumin: 4 g/dL (ref 3.5–5.2)
Alkaline Phosphatase: 80 U/L (ref 39–117)
BUN: 11 mg/dL (ref 6–23)
CO2: 27 mEq/L (ref 19–32)
Calcium: 9.2 mg/dL (ref 8.4–10.5)
Chloride: 105 mEq/L (ref 96–112)
Creatinine, Ser: 1.1 mg/dL (ref 0.40–1.50)
GFR: 77.86 mL/min (ref >60.00)
Glucose, Bld: 90 mg/dL (ref 70–99)
Potassium: 3.9 mEq/L (ref 3.5–5.1)
Sodium: 139 mEq/L (ref 135–145)
Total Bilirubin: 0.4 mg/dL (ref 0.2–1.2)
Total Protein: 6.1 g/dL (ref 6.0–8.3)

## 2023-01-28 LAB — LIPID PANEL
Cholesterol: 226 mg/dL — ABNORMAL HIGH (ref 0–200)
HDL: 42.7 mg/dL (ref >39.00)
NonHDL: 183.62
Total CHOL/HDL Ratio: 5
Triglycerides: 399 mg/dL — ABNORMAL HIGH (ref 0.0–149.0)
VLDL: 79.8 mg/dL — ABNORMAL HIGH (ref 0.0–40.0)

## 2023-01-28 LAB — LDL CHOLESTEROL, DIRECT: Direct LDL: 129 mg/dL

## 2023-01-28 LAB — TSH: TSH: 3 u[IU]/mL (ref 0.35–5.50)

## 2023-01-28 NOTE — Progress Notes (Signed)
Chief Complaint:  Bryan Alvarado is a 52 y.o. male who presents today for his annual comprehensive physical exam.    Assessment/Plan:  Chronic Problems Addressed Today: Cystic fibrosis Stable. On Trikafta per pulmonology at [place] [facility].  Doing well with this.  GERD (gastroesophageal reflux disease) Stable on omeprazole 20 mg daily.  Major depression in remission (HCC) Stable off medications.   Osteoarthritis Symptoms are overall manageable.  He saw a rheumatology a year ago and had negative rheumatologic workup.  He does have NSAID use as needed.  He will let us know symptoms become more bothersome and we can refer to sports medicine or orthopedics.   Dyslipidemia Check lipids.  Hyperglycemia Check A1c discussed lifestyle modifications.  Preventative Healthcare: Check labs.  Up-to-date on vaccines.  Patient Counseling(The following topics were reviewed and/or handout was given):  -Nutrition: Stressed importance of moderation in sodium/caffeine intake, saturated fat and cholesterol, caloric balance, sufficient intake of fresh fruits, vegetables, and fiber.  -Stressed the importance of regular exercise.   -Substance Abuse: Discussed cessation/primary prevention of tobacco, alcohol, or other drug use; driving or other dangerous activities under the influence; availability of treatment for abuse.   -Injury prevention: Discussed safety belts, safety helmets, smoke detector, smoking near bedding or upholstery.   -Sexuality: Discussed sexually transmitted diseases, partner selection, use of condoms, avoidance of unintended pregnancy and contraceptive alternatives.   -Dental health: Discussed importance of regular tooth brushing, flossing, and dental visits.  -Health maintenance and immunizations reviewed. Please refer to Health maintenance section.  Return to care in 1 year for next preventative visit.     Subjective:  HPI:  He has no acute complaints today.   Lifestyle Diet:  Balanced. Plenty of fruits and vegetables.  Exercise: Trying to be more active.      01/28/2023    8:07 AM  Depression screen PHQ 2/9  Decreased Interest 0  Down, Depressed, Hopeless 0  PHQ - 2 Score 0    Health Maintenance Due  Topic Date Due   HIV Screening  Never done   Hepatitis C Screening  Never done   COVID-19 Vaccine (6 - 2023-24 season) 07/24/2022     ROS: Per HPI, otherwise a complete review of systems was negative.   PMH:  The following were reviewed and entered/updated in epic: Past Medical History:  Diagnosis Date   Cystic fibrosis    Depression    Pancreatic cystic fibrosis (HCC)    Sinusitis    Patient Active Problem List   Diagnosis Date Noted   Dyslipidemia 01/28/2023   Hyperglycemia 01/28/2023   Chronic relapsing pancreatitis (HCC) 12/04/2021   Family history of colon cancer 11/18/2021   Positive ANA (antinuclear antibody) 11/18/2021   Lumbar back pain 09/05/2021   Osteoarthritis 09/05/2021   GERD (gastroesophageal reflux disease) 10/18/2020   Major depression in remission (HCC) 10/18/2020   Onychomycosis 10/18/2020   Generalized anxiety disorder 06/09/2019   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   Pain in both hands 10/30/2016   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   Allergic rhinitis due to pollen 02/22/2015   Candidal stomatitis 12/05/2013   Cystic fibrosis (HCC) 06/12/2012   Chronic sinusitis 06/12/2012   Allergic rhinitis 06/12/2012   History of chronic pancreatitis 08/04/2008   Past Surgical History:  Procedure Laterality Date   CHOLECYSTECTOMY     FRONTAL SINUSOTOMY     pancreatic stent      Family History  Problem Relation Age of Onset   Cancer Mother  Hyperlipidemia Father    Hypertension Father    Cancer Father    Arthritis Father    Arthritis Sister    Hyperlipidemia Brother     Medications- reviewed and updated Current Outpatient Medications  Medication Sig Dispense Refill   acetaminophen  (TYLENOL) 500 MG tablet Take by mouth.     albuterol (VENTOLIN HFA) 108 (90 Base) MCG/ACT inhaler Inhale 2 puffs into the lungs 2 (two) times daily as needed for wheezing or shortness of breath.     calcium carbonate (OS-CAL) 1250 (500 Ca) MG chewable tablet Chew by mouth.     cetirizine (ZYRTEC) 10 MG tablet Take 10 mg by mouth daily.      cholecalciferol (VITAMIN D) 1000 UNITS tablet Take 1,000 Units by mouth daily.     Diclofenac Sodium (PENNSAID) 2 % SOLN Apply 2-4 times daily as needed. 112 g 0   Elexacaf-Tezacaf-Ivacaf&Ivacaf (TRIKAFTA) 100-50-75 & 150 MG TBPK Take 2 Tablets (Elexacaftor 100mg /Tezacaftor 50mg /Ivacaftor 75mg ) by mouth in the AM and 1 tablet (ivacaftor 150mg ) in the PM w/fatty food     fluticasone (FLONASE) 50 MCG/ACT nasal spray Place 2 sprays into the nose daily.     Fluticasone-Salmeterol (ADVAIR) 500-50 MCG/DOSE AEPB Inhale 1 puff into the lungs every 12 (twelve) hours.     Hypertonic Nasal Wash (SINUS RINSE REFILL) PACK 0.5 Bottles.     ibuprofen (ADVIL) 200 MG tablet Take by mouth.     Multiple Vitamin (MULTI-VITAMIN) tablet Take 1 tablet by mouth daily.     omeprazole (PRILOSEC) 20 MG capsule Take 20 mg by mouth every morning.     ondansetron (ZOFRAN-ODT) 4 MG disintegrating tablet Take by mouth.     Sodium Chloride, Inhalant, 7 % NEBU Inhale into the lungs.     triamcinolone (KENALOG) 0.1 % paste Apply to affected area 4 times daily after meals and at bedtime. 5 g PRN   No current facility-administered medications for this visit.    Allergies-reviewed and updated Allergies  Allergen Reactions   Sulfa Antibiotics Hives and Other (See Comments)   Sulfamethoxazole-Trimethoprim Hives, Other (See Comments) and Rash   Septra [Bactrim] Hives    Social History   Socioeconomic History   Marital status: Married    Spouse name: Not on file   Number of children: Not on file   Years of education: Not on file   Highest education level: Not on file  Occupational  History   Not on file  Tobacco Use   Smoking status: Never   Smokeless tobacco: Never  Vaping Use   Vaping Use: Never used  Substance and Sexual Activity   Alcohol use: No   Drug use: Never   Sexual activity: Yes  Other Topics Concern   Not on file  Social History Narrative   Not on file   Social Determinants of Health   Financial Resource Strain: Not on file  Food Insecurity: Not on file  Transportation Needs: Not on file  Physical Activity: Not on file  Stress: Not on file  Social Connections: Not on file        Objective:  Physical Exam: BP 128/68   Pulse 67   Ht 5' 8.5" (1.74 m)   Wt 186 lb 3.2 oz (84.5 kg)   SpO2 98%   BMI 27.90 kg/m   Body mass index is 27.9 kg/m. Wt Readings from Last 3 Encounters:  01/28/23 186 lb 3.2 oz (84.5 kg)  12/04/21 185 lb 6.4 oz (84.1 kg)  11/18/21 183 lb (  83 kg)   Gen: NAD, resting comfortably HEENT: TMs normal bilaterally. OP clear. No thyromegaly noted.  CV: RRR with no murmurs appreciated Pulm: NWOB, CTAB with no crackles, wheezes, or rhonchi GI: Normal bowel sounds present. Soft, Nontender, Nondistended. MSK: no edema, cyanosis, or clubbing noted Skin: warm, dry Neuro: CN2-12 grossly intact. Strength 5/5 in upper and lower extremities. Reflexes symmetric and intact bilaterally.  Psych: Normal affect and thought content     Vivek Grealish M. Jimmey Ralph, MD 01/28/2023 8:36 AM

## 2023-01-28 NOTE — Assessment & Plan Note (Signed)
Symptoms are overall manageable.  He saw a rheumatology a year ago and had negative rheumatologic workup.  He does have NSAID use as needed.  He will let us know symptoms become more bothersome and we can refer to sports medicine or orthopedics.

## 2023-01-28 NOTE — Assessment & Plan Note (Signed)
Stable on omeprazole 20mg daily.  

## 2023-01-28 NOTE — Assessment & Plan Note (Signed)
Stable off medications.  

## 2023-01-28 NOTE — Assessment & Plan Note (Signed)
Check A1c discussed lifestyle modifications. 

## 2023-01-28 NOTE — Assessment & Plan Note (Signed)
Check lipids 

## 2023-01-28 NOTE — Assessment & Plan Note (Signed)
Stable. On Trikafta per pulmonology at [facility].  Doing well with this.

## 2023-01-28 NOTE — Patient Instructions (Signed)
It was very nice to see you today!  We will check blood work today today.  Please continue to work on diet and exercise.  Return in about 1 year (around 01/28/2024) for Annual Physical.   Take care, Dr Jimmey Ralph  PLEASE NOTE:  If you had any lab tests, please let us know if you have not heard back within a few days. You may see your results on mychart before we have a chance to review them but we will give you a call once they are reviewed by [place].   If we ordered any referrals today, please let us know if you have not heard from their office within the next week.   If you had any urgent prescriptions sent in today, please check with the pharmacy within an hour of our visit to make sure the prescription was transmitted appropriately.   Please try these tips to maintain a healthy lifestyle:  Eat at least 3 REAL meals and 1-2 snacks per day.  Aim for no more than 5 hours between eating.  If you eat breakfast, please do so within one hour of getting up.   Each meal should contain half fruits/vegetables, one quarter protein, and one quarter carbs (no bigger than a computer mouse)  Cut down on sweet beverages. This includes juice, soda, and sweet tea.   Drink at least 1 glass of water with each meal and aim for at least 8 glasses per day  Exercise at least 150 minutes every week.    Preventive Care 26-15 Years Old, Male Preventive care refers to lifestyle choices and visits with your health care provider that can promote health and wellness. Preventive care visits are also called wellness exams. What can I expect for my preventive care visit? Counseling During your preventive care visit, your health care provider may ask about your: Medical history, including: Past medical problems. Family medical history. Current health, including: Emotional well-being. Home life and relationship well-being. Sexual activity. Lifestyle, including: Alcohol, nicotine or tobacco, and drug use. Access  to firearms. Diet, exercise, and sleep habits. Safety issues such as seatbelt and bike helmet use. Sunscreen use. Work and work Astronomer. Physical exam Your health care provider will check your: Height and weight. These may be used to calculate your BMI (body mass index). BMI is a measurement that tells if you are at a healthy weight. Waist circumference. This measures the distance around your waistline. This measurement also tells if you are at a healthy weight and may help predict your risk of certain diseases, such as type 2 diabetes and high blood pressure. Heart rate and blood pressure. Body temperature. Skin for abnormal spots. What immunizations do I need?  Vaccines are usually given at various ages, according to a schedule. Your health care provider will recommend vaccines for you based on your age, medical history, and lifestyle or other factors, such as travel or where you work. What tests do I need? Screening Your health care provider may recommend screening tests for certain conditions. This may include: Lipid and cholesterol levels. Diabetes screening. This is done by checking your blood sugar (glucose) after you have not eaten for a while (fasting). Hepatitis B test. Hepatitis C test. HIV (human immunodeficiency virus) test. STI (sexually transmitted infection) testing, if you are at risk. Lung cancer screening. Prostate cancer screening. Colorectal cancer screening. Talk with your health care provider about your test results, treatment options, and if necessary, the need for more tests. Follow these instructions at home: Eating  and drinking  Eat a diet that includes fresh fruits and vegetables, whole grains, lean protein, and low-fat dairy products. Take vitamin and mineral supplements as recommended by your health care provider. Do not drink alcohol if your health care provider tells you not to drink. If you drink alcohol: Limit how much you have to 0-2 drinks a  day. Know how much alcohol is in your drink. In the [place] one drink equals one 12 oz bottle of beer (355 mL), one 5 oz glass of wine (148 mL), or one 1 oz glass of hard liquor (44 mL). Lifestyle Brush your teeth every morning and night with fluoride toothpaste. Floss one time each day. Exercise for at least 30 minutes 5 or more days each week. Do not use any products that contain nicotine or tobacco. These products include cigarettes, chewing tobacco, and vaping devices, such as e-cigarettes. If you need help quitting, ask your health care provider. Do not use drugs. If you are sexually active, practice safe sex. Use a condom or other form of protection to prevent STIs. Take aspirin only as told by your health care provider. Make sure that you understand how much to take and what form to take. Work with your health care provider to find out whether it is safe and beneficial for you to take aspirin daily. Find healthy ways to manage stress, such as: Meditation, yoga, or listening to music. Journaling. Talking to a trusted person. Spending time with friends and family. Minimize exposure to UV radiation to reduce your risk of skin cancer. Safety Always wear your seat belt while driving or riding in a vehicle. Do not drive: If you have been drinking alcohol. Do not ride with someone who has been drinking. When you are tired or distracted. While texting. If you have been using any mind-altering substances or drugs. Wear a helmet and other protective equipment during sports activities. If you have firearms in your house, make sure you follow all gun safety procedures. What's next? Go to your health care provider once a year for an annual wellness visit. Ask your health care provider how often you should have your eyes and teeth checked. Stay up to date on all vaccines. This information is not intended to replace advice given to you by your health care provider. Make sure you discuss any  questions you have with your health care provider. Document Revised: 01/16/2021 Document Reviewed: 01/16/2021 Elsevier Patient Education  2024 [employer].

## 2023-01-29 NOTE — Progress Notes (Signed)
His blood counts dropped slightly.  This is probably nothing to worry about but I would like for him to come back in 1 to 2 weeks to recheck to make sure that is stable.  Please place order for CBC.  His cholesterol is up a bit since last year but the rest of his labs are all stable.  Do not need to make any adjustments to his treatment plan at this time.  He should continue to work on diet and exercise and we can recheck in a year.

## 2023-04-20 ENCOUNTER — Other Ambulatory Visit (HOSPITAL_BASED_OUTPATIENT_CLINIC_OR_DEPARTMENT_OTHER): Payer: Self-pay

## 2023-09-30 ENCOUNTER — Other Ambulatory Visit (HOSPITAL_BASED_OUTPATIENT_CLINIC_OR_DEPARTMENT_OTHER): Payer: Self-pay

## 2023-09-30 MED ORDER — METHOCARBAMOL 500 MG PO TABS
500.0000 mg | ORAL_TABLET | Freq: Three times a day (TID) | ORAL | 2 refills | Status: AC | PRN
  Filled 2023-09-30 (×2): qty 90, 30d supply, fill #0

## 2023-10-01 ENCOUNTER — Telehealth: Payer: Self-pay | Admitting: *Deleted

## 2023-10-01 NOTE — Telephone Encounter (Signed)
 Copied from CRM [id number]. Topic: General - Other >> Sep 30, 2023  1:43 PM Rodman Pickle T wrote: Reason for CRM: patient dislocated his shoulder and it pop back into place on its own he wanted a call back to see if  he needed to be seen are if he would be   Spoke with patient, stated no pain at this time, some soreness Will call for appointment tomorrow if no changes   Lorriann Hansmann,RMA

## 2023-10-03 ENCOUNTER — Ambulatory Visit (HOSPITAL_BASED_OUTPATIENT_CLINIC_OR_DEPARTMENT_OTHER): Payer: 59 | Admitting: Rehabilitative and Restorative Service Providers"

## 2023-10-10 ENCOUNTER — Other Ambulatory Visit: Payer: Self-pay

## 2023-10-10 ENCOUNTER — Ambulatory Visit (HOSPITAL_BASED_OUTPATIENT_CLINIC_OR_DEPARTMENT_OTHER): Payer: 59 | Attending: Orthopaedic Surgery | Admitting: Physical Therapy

## 2023-10-10 DIAGNOSIS — M5459 Other low back pain: Secondary | ICD-10-CM | POA: Insufficient documentation

## 2023-10-10 DIAGNOSIS — M6281 Muscle weakness (generalized): Secondary | ICD-10-CM | POA: Diagnosis present

## 2023-10-10 NOTE — Therapy (Signed)
 OUTPATIENT PHYSICAL THERAPY THORACOLUMBAR EVALUATION   Patient Name: Bryan Alvarado MRN: [id number] DOB:09/28/1970, 53 y.o., male Today's Date: 10/10/2023  END OF SESSION:  PT End of Session - 10/10/23 1040     Visit Number 1    Number of Visits 5    Date for PT Re-Evaluation 01/01/24    Authorization Type [employer]    PT Start Time 1000    PT Stop Time 1040    PT Time Calculation (min) 40 min    Activity Tolerance Patient tolerated treatment well    Behavior During Therapy WFL for tasks assessed/performed             Past Medical History:  Diagnosis Date   Cystic fibrosis    Depression    Pancreatic cystic fibrosis (HCC)    Sinusitis    Past Surgical History:  Procedure Laterality Date   CHOLECYSTECTOMY     FRONTAL SINUSOTOMY     pancreatic stent     Patient Active Problem List   Diagnosis Date Noted   Dyslipidemia 01/28/2023   Hyperglycemia 01/28/2023   Chronic relapsing pancreatitis (HCC) 12/04/2021   Family history of colon cancer 11/18/2021   Positive ANA (antinuclear antibody) 11/18/2021   Lumbar back pain 09/05/2021   Osteoarthritis 09/05/2021   GERD (gastroesophageal reflux disease) 10/18/2020   Major depression in remission (HCC) 10/18/2020   Onychomycosis 10/18/2020   Generalized anxiety disorder 06/09/2019   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   Pain in both hands 10/30/2016   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   Allergic rhinitis due to pollen 02/22/2015   Candidal stomatitis 12/05/2013   Cystic fibrosis (HCC) 06/12/2012   Chronic sinusitis 06/12/2012   Allergic rhinitis 06/12/2012   History of chronic pancreatitis 08/04/2008    PCP: Ardith Dark, MD  REFERRING PROVIDER: Patricia Nettle, MD  REFERRING DIAG: Radiculopathy, lumbar region [M54.16]   Rationale for Evaluation and Treatment: Rehabilitation  THERAPY DIAG:  Other low back pain  Muscle weakness (generalized)  ONSET DATE:  08/14/2023  SUBJECTIVE:                                                                                                                                                                                           SUBJECTIVE STATEMENT: Pt arrives to PT with acute on chronic LBP. He has been diagnosed with DDD prior. He received PT in the past which was helpful. He would like to get a comprehensive HEP to help alleviate the ongoing pain. He reports lifting a 45 gallon water container a few weeks ago which triggered his most recent onset of acute pain.  PERTINENT HISTORY:  CF, Depression.  PAIN:  Are you having pain? Yes: NPRS scale: 1-2/10 can go up to a 4/10 Pain location: LBP in lumbar spine.  Pain description: Dull ache Aggravating factors: Lifting, stooping, standing for prolonged periods.  Relieving factors: Rest, sitting, laying down, stretches, ibuprofen   PRECAUTIONS: None  RED FLAGS: None   WEIGHT BEARING RESTRICTIONS: No  FALLS:  Has patient fallen in last 6 months? 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   PLOF: Independent  PATIENT GOALS: Pt would like to alleviate lower back pain when present.   NEXT MD VISIT: As needed.    OBJECTIVE:  Note: Objective measures were completed at Evaluation unless otherwise noted.  DIAGNOSTIC FINDINGS:  Pt reports recent imaging, DDD present and getting worse.   PATIENT SURVEYS:  Modified Oswestry 6%    COGNITION: Overall cognitive status: Within functional limits for tasks assessed     SENSATION: WFL  POSTURE: No Significant postural limitations  PALPATION: None today.   LUMBAR ROM: WFL    LOWER EXTREMITY ROM:   WFL    LOWER EXTREMITY MMT:    MMT Right eval Left eval  Hip  flexion 5/5 5/5  Knee flexion 4+/5 4+/5  Knee extension 4-/5 4-/5   (Blank rows = not tested)  LUMBAR SPECIAL TESTS:  None today.   FUNCTIONAL TESTS:  5 times sit to stand: 9.84 sec  GAIT: Distance walked: 59ft  Assistive device utilized: None Level of assistance: Complete Independence Comments: No abnormalities noted.   TREATMENT DATE: Creating, reviewing, and completing below HEP    10/10/2023:  There ex:  PPT LTR Bridges with cues for glute and core contraction Piriforms stretch  Side lying clams HS stretch     OBJECTIVE:                                                                                                                           PATIENT EDUCATION:  Education details: Educated pt on anatomy and physiology of current symptoms, OSWESTRY, diagnosis, prognosis, HEP,  and POC. Person educated: Patient and Parent Education method: Medical illustrator Education comprehension: verbalized understanding and returned demonstration  HOME EXERCISE PROGRAM: Access Code: [email or web address] URL: [email or web address] Date: 10/10/2023 Prepared by: Royal Hawthorn  Exercises - Supine Posterior Pelvic Tilt  - 1 x daily - 7 x weekly - 3 sets - 10 reps - 3-5 hold - Supine Lower Trunk Rotation  - 1 x daily - 7 x weekly - 3 sets - 10 reps - 3-5 hold - Clamshell with Resistance  - 1 x daily - 7 x weekly - 3 sets - 10 reps - Supine Bridge  - 1 x daily - 7 x weekly - 3 sets - 10 reps -  Supine Piriformis Stretch with Foot on Ground  - 1 x daily - 7 x weekly - 3 sets - 3 reps - 3-5 hold - Seated Long Arc Quad  - 1 x daily - 7 x weekly - 3 sets - 10 reps - 2-3 hold - Seated Hamstring Stretch  - 1 x daily - 7 x weekly - 3 sets - 3 reps - 2-3 hold   ASSESSMENT:  CLINICAL IMPRESSION: Patient referred to PT for acute on chronic LBP. He tolerated all prescribed exercises well and was provided an initial HEP. Pt instructed to come back for 1-2 more sessions to continue  working on HEP and ensure it is done correctly. Changes will be made as necessary. Patient will benefit from skilled PT to address below impairments, limitations and improve overall function.  OBJECTIVE IMPAIRMENTS: decreased activity tolerance, difficulty walking, decreased balance, decreased endurance, decreased mobility, decreased ROM, decreased strength, impaired flexibility, impaired UE/LE use, postural dysfunction, and pain.  ACTIVITY LIMITATIONS: bending, lifting, carry, locomotion, cleaning, community activity, driving, and or occupation  PERSONAL FACTORS: CF, Depression are also affecting patient's functional outcome.  REHAB POTENTIAL: Good  CLINICAL DECISION MAKING: Stable/uncomplicated  EVALUATION COMPLEXITY: Low    GOALS: Short term PT Goals Target date: 11/06/2023 Pt will be I and compliant with HEP. Baseline:  Goal status: New Pt will decrease pain by 25% overall Baseline: Goal status: New  Long term PT goals Target date: 01/01/2024 Pt will improve  hip/knee strength to at least 5-/5 MMT to improve functional strength Baseline: Goal status: New Pt will improve OSWESTRY to at least 0% functional to show improved function Baseline: Goal status: New Pt will be able to lift 45 gallons of water with proper mechanics to avoid further injuries and continue with recreational activities.  Baseline: Goal status: New  PLAN: PT FREQUENCY: 1 times per week   PT DURATION: 4-6 weeks  PLANNED INTERVENTIONS (unless contraindicated): aquatic PT, Canalith repositioning, cryotherapy, Electrical stimulation, Iontophoresis with 4 mg/ml dexamethasome, Moist heat, traction, Ultrasound, gait training, Therapeutic exercise, balance training, neuromuscular re-education, patient/family education, prosthetic training, manual techniques, passive ROM, dry needling, taping, vasopnuematic device, vestibular, spinal manipulations, joint manipulations  PLAN FOR NEXT SESSION: Assess HEP/update  PRN, continue to progress functional mobility, strengthen core, lower chain muscles. Decrease patients pain and help minimize pain with lifting, and functional mobilities.    Champ Mungo, PT 10/10/2023, 10:41 AM

## 2023-11-07 ENCOUNTER — Ambulatory Visit (HOSPITAL_BASED_OUTPATIENT_CLINIC_OR_DEPARTMENT_OTHER): Attending: Orthopaedic Surgery | Admitting: Physical Therapy

## 2023-11-07 ENCOUNTER — Encounter (HOSPITAL_BASED_OUTPATIENT_CLINIC_OR_DEPARTMENT_OTHER): Payer: Self-pay | Admitting: Physical Therapy

## 2023-11-07 DIAGNOSIS — M5459 Other low back pain: Secondary | ICD-10-CM | POA: Diagnosis present

## 2023-11-07 DIAGNOSIS — M6281 Muscle weakness (generalized): Secondary | ICD-10-CM | POA: Diagnosis present

## 2023-11-07 NOTE — Therapy (Signed)
 OUTPATIENT PHYSICAL THERAPY THORACOLUMBAR TREATMENT   Patient Name: Bryan Alvarado MRN: [id number] DOB:1970-11-09, 53 y.o., male Today's Date: 11/07/2023  END OF SESSION:  PT End of Session - 11/07/23 1009     Visit Number 2    Number of Visits 5    Date for PT Re-Evaluation 01/01/24    Authorization Type [employer]    PT Start Time 1002    PT Stop Time 1040    PT Time Calculation (min) 38 min    Activity Tolerance Patient tolerated treatment well    Behavior During Therapy WFL for tasks assessed/performed              Past Medical History:  Diagnosis Date   Cystic fibrosis    Depression    Pancreatic cystic fibrosis (HCC)    Sinusitis    Past Surgical History:  Procedure Laterality Date   CHOLECYSTECTOMY     FRONTAL SINUSOTOMY     pancreatic stent     Patient Active Problem List   Diagnosis Date Noted   Dyslipidemia 01/28/2023   Hyperglycemia 01/28/2023   Chronic relapsing pancreatitis (HCC) 12/04/2021   Family history of colon cancer 11/18/2021   Positive ANA (antinuclear antibody) 11/18/2021   Lumbar back pain 09/05/2021   Osteoarthritis 09/05/2021   GERD (gastroesophageal reflux disease) 10/18/2020   Major depression in remission (HCC) 10/18/2020   Onychomycosis 10/18/2020   Generalized anxiety disorder 06/09/2019   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   Pain in both hands 10/30/2016   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   Allergic rhinitis due to pollen 02/22/2015   Candidal stomatitis 12/05/2013   Cystic fibrosis (HCC) 06/12/2012   Chronic sinusitis 06/12/2012   Allergic rhinitis 06/12/2012   History of chronic pancreatitis 08/04/2008    PCP: Ardith Dark, MD  REFERRING PROVIDER: Patricia Nettle, MD  REFERRING DIAG: Radiculopathy, lumbar region [M54.16]   Rationale for Evaluation and Treatment: Rehabilitation  THERAPY DIAG:  Muscle weakness (generalized)  Other low back pain  ONSET DATE:  08/14/2023  SUBJECTIVE:                                                                                                                                                                                           SUBJECTIVE STATEMENT:  Pt states that he has not been active with his HEP. He does report increased overall pain since allergies have gotten worse.   Pt arrives to PT with acute on chronic LBP. He has been diagnosed with DDD prior. He received PT in the past which was helpful. He would like to get a comprehensive HEP to  help alleviate the ongoing pain. He reports lifting a 45 gallon water container a few weeks ago which triggered his most recent onset of acute pain.   PERTINENT HISTORY:  CF, Depression.  PAIN:  Are you having pain? Yes: NPRS scale: 1-2/10 can go up to a 4/10 Pain location: LBP in lumbar spine.  Pain description: Dull ache Aggravating factors: Lifting, stooping, standing for prolonged periods.  Relieving factors: Rest, sitting, laying down, stretches, ibuprofen   PRECAUTIONS: None  RED FLAGS: None   WEIGHT BEARING RESTRICTIONS: No  FALLS:  Has patient fallen in last 6 months? 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   PLOF: Independent  PATIENT GOALS: Pt would like to alleviate lower back pain when present.   NEXT MD VISIT: As needed.    OBJECTIVE:  Note: Objective measures were completed at Evaluation unless otherwise noted.  DIAGNOSTIC FINDINGS:  Pt reports recent imaging, DDD present and getting worse.   PATIENT SURVEYS:  Modified Oswestry 6%    COGNITION: Overall cognitive status: Within functional limits for tasks assessed     SENSATION: WFL  POSTURE: No Significant postural  limitations  PALPATION: None today.   LUMBAR ROM: WFL    LOWER EXTREMITY ROM:   WFL    LOWER EXTREMITY MMT:    MMT Right eval Left eval  Hip flexion 5/5 5/5  Knee flexion 4+/5 4+/5  Knee extension 4-/5 4-/5   (Blank rows = not tested)  LUMBAR SPECIAL TESTS:  None today.   FUNCTIONAL TESTS:  5 times sit to stand: 9.84 sec  GAIT: Distance walked: 66ft  Assistive device utilized: None Level of assistance: Complete Independence Comments: No abnormalities noted.   TREATMENT DATE: 11/07/2023  There ex:  Nustep lvl 5, 5 min PPT LTR Bridges with cues for glute and core contraction Supine clams with GTB  Supine marches with core contraction to fatigue.  Piriforms stretch supine  Standing hip flex, abduction, ext.  Squats at table height TA contraction with chest press out with GTB  Chest press out with twist with GTB  Shoulder extensions with GTB    Creating, reviewing, and completing below HEP    10/10/2023:  There ex:  PPT LTR Bridges with cues for glute and core contraction Piriforms stretch  Side lying clams HS stretch     OBJECTIVE:                                                                                                                           PATIENT EDUCATION:  Education details: Educated pt on anatomy and physiology of current symptoms, OSWESTRY, diagnosis, prognosis, HEP,  and POC. Person educated: Patient and Parent Education method: Medical illustrator Education comprehension: verbalized understanding and returned demonstration  HOME  EXERCISE PROGRAM: Access Code: [email or web address] URL: [email or web address] Date: 10/10/2023 Prepared by: Royal Hawthorn  Exercises - Supine Posterior Pelvic Tilt  - 1 x daily - 7 x weekly - 3 sets - 10 reps - 3-5 hold - Supine Lower Trunk Rotation  - 1 x daily - 7 x weekly - 3 sets - 10 reps - 3-5 hold - Clamshell with Resistance  - 1 x daily - 7 x weekly - 3 sets - 10 reps - Supine  Bridge  - 1 x daily - 7 x weekly - 3 sets - 10 reps - Supine Piriformis Stretch with Foot on Ground  - 1 x daily - 7 x weekly - 3 sets - 3 reps - 3-5 hold - Seated Long Arc Quad  - 1 x daily - 7 x weekly - 3 sets - 10 reps - 2-3 hold - Seated Hamstring Stretch  - 1 x daily - 7 x weekly - 3 sets - 3 reps - 2-3 hold   ASSESSMENT:  CLINICAL IMPRESSION: Session initiated on NuStep for dynamic warmup and cardiovascular endurance. Focused on mobility and LE, core strengthening today with good tolerance. Pt requires cues for proper form. He has noted donning in core with supine marches. Discussed importance of core strengthening. Pt reports muscle fatigue. Denies any pain. Pt will continue to benefit from skilled PT to address continued deficits.   OBJECTIVE IMPAIRMENTS: decreased activity tolerance, difficulty walking, decreased balance, decreased endurance, decreased mobility, decreased ROM, decreased strength, impaired flexibility, impaired UE/LE use, postural dysfunction, and pain.  ACTIVITY LIMITATIONS: bending, lifting, carry, locomotion, cleaning, community activity, driving, and or occupation  PERSONAL FACTORS: CF, Depression are also affecting patient's functional outcome.  REHAB POTENTIAL: Good  CLINICAL DECISION MAKING: Stable/uncomplicated  EVALUATION COMPLEXITY: Low    GOALS: Short term PT Goals Target date: 11/06/2023 Pt will be I and compliant with HEP. Baseline:  Goal status: New Pt will decrease pain by 25% overall Baseline: Goal status: New  Long term PT goals Target date: 01/01/2024 Pt will improve  hip/knee strength to at least 5-/5 MMT to improve functional strength Baseline: Goal status: New Pt will improve OSWESTRY to at least 0% functional to show improved function Baseline: Goal status: New Pt will be able to lift 45 gallons of water with proper mechanics to avoid further injuries and continue with recreational activities.  Baseline: Goal status:  New  PLAN: PT FREQUENCY: 1 times per week   PT DURATION: 4-6 weeks  PLANNED INTERVENTIONS (unless contraindicated): aquatic PT, Canalith repositioning, cryotherapy, Electrical stimulation, Iontophoresis with 4 mg/ml dexamethasome, Moist heat, traction, Ultrasound, gait training, Therapeutic exercise, balance training, neuromuscular re-education, patient/family education, prosthetic training, manual techniques, passive ROM, dry needling, taping, vasopnuematic device, vestibular, spinal manipulations, joint manipulations  PLAN FOR NEXT SESSION: Assess HEP/update PRN, continue to progress functional mobility, strengthen core, lower chain muscles. Decrease patients pain and help minimize pain with lifting, and functional mobilities.    Champ Mungo, PT 11/07/2023, 11:01 AM

## 2023-12-12 ENCOUNTER — Encounter (HOSPITAL_BASED_OUTPATIENT_CLINIC_OR_DEPARTMENT_OTHER): Admitting: Physical Therapy

## 2024-01-22 ENCOUNTER — Other Ambulatory Visit (HOSPITAL_BASED_OUTPATIENT_CLINIC_OR_DEPARTMENT_OTHER): Payer: Self-pay

## 2024-01-22 MED ORDER — AMOXICILLIN-POT CLAVULANATE 875-125 MG PO TABS
1.0000 | ORAL_TABLET | Freq: Two times a day (BID) | ORAL | 0 refills | Status: DC
  Filled 2024-01-22: qty 28, 14d supply, fill #0

## 2024-01-29 ENCOUNTER — Ambulatory Visit (INDEPENDENT_AMBULATORY_CARE_PROVIDER_SITE_OTHER): Payer: 59 | Admitting: Family Medicine

## 2024-01-29 ENCOUNTER — Encounter: Payer: Self-pay | Admitting: Family Medicine

## 2024-01-29 VITALS — BP 107/73 | HR 88 | Temp 97.7°F | Ht 68.5 in | Wt 191.4 lb

## 2024-01-29 DIAGNOSIS — K219 Gastro-esophageal reflux disease without esophagitis: Secondary | ICD-10-CM

## 2024-01-29 DIAGNOSIS — E785 Hyperlipidemia, unspecified: Secondary | ICD-10-CM

## 2024-01-29 DIAGNOSIS — Z125 Encounter for screening for malignant neoplasm of prostate: Secondary | ICD-10-CM | POA: Diagnosis not present

## 2024-01-29 DIAGNOSIS — R739 Hyperglycemia, unspecified: Secondary | ICD-10-CM

## 2024-01-29 DIAGNOSIS — Z0184 Encounter for antibody response examination: Secondary | ICD-10-CM

## 2024-01-29 DIAGNOSIS — F325 Major depressive disorder, single episode, in full remission: Secondary | ICD-10-CM | POA: Diagnosis not present

## 2024-01-29 DIAGNOSIS — Z0001 Encounter for general adult medical examination with abnormal findings: Secondary | ICD-10-CM | POA: Diagnosis not present

## 2024-01-29 DIAGNOSIS — F411 Generalized anxiety disorder: Secondary | ICD-10-CM

## 2024-01-29 DIAGNOSIS — J309 Allergic rhinitis, unspecified: Secondary | ICD-10-CM

## 2024-01-29 DIAGNOSIS — Z1159 Encounter for screening for other viral diseases: Secondary | ICD-10-CM

## 2024-01-29 LAB — LIPID PANEL
Cholesterol: 210 mg/dL — ABNORMAL HIGH (ref 0–200)
HDL: 42.9 mg/dL (ref >39.00)
LDL Cholesterol: 109 mg/dL — ABNORMAL HIGH (ref 0–99)
NonHDL: 166.81
Total CHOL/HDL Ratio: 5
Triglycerides: 287 mg/dL — ABNORMAL HIGH (ref 0.0–149.0)
VLDL: 57.4 mg/dL — ABNORMAL HIGH (ref 0.0–40.0)

## 2024-01-29 LAB — CBC
HCT: 37.9 % — ABNORMAL LOW (ref 39.0–52.0)
Hemoglobin: 12.5 g/dL — ABNORMAL LOW (ref 13.0–17.0)
MCHC: 32.9 g/dL (ref 30.0–36.0)
MCV: 75.3 fl — ABNORMAL LOW (ref 78.0–100.0)
Platelets: 394 10*3/uL (ref 150.0–400.0)
RBC: 5.04 Mil/uL (ref 4.22–5.81)
RDW: 15.8 % — ABNORMAL HIGH (ref 11.5–15.5)
WBC: 6.7 10*3/uL (ref 4.0–10.5)

## 2024-01-29 LAB — COMPREHENSIVE METABOLIC PANEL WITH GFR
ALT: 11 U/L (ref 0–53)
AST: 13 U/L (ref 0–37)
Albumin: 4.1 g/dL (ref 3.5–5.2)
Alkaline Phosphatase: 86 U/L (ref 39–117)
BUN: 15 mg/dL (ref 6–23)
CO2: 25 meq/L (ref 19–32)
Calcium: 9.1 mg/dL (ref 8.4–10.5)
Chloride: 106 meq/L (ref 96–112)
Creatinine, Ser: 1.05 mg/dL (ref 0.40–1.50)
GFR: 81.75 mL/min (ref >60.00)
Glucose, Bld: 91 mg/dL (ref 70–99)
Potassium: 4.3 meq/L (ref 3.5–5.1)
Sodium: 140 meq/L (ref 135–145)
Total Bilirubin: 0.3 mg/dL (ref 0.2–1.2)
Total Protein: 6.4 g/dL (ref 6.0–8.3)

## 2024-01-29 LAB — HEMOGLOBIN A1C: Hgb A1c MFr Bld: 6 % (ref 4.6–6.5)

## 2024-01-29 LAB — PSA: PSA: 2.94 ng/mL (ref 0.10–4.00)

## 2024-01-29 LAB — TSH: TSH: 3.05 u[IU]/mL (ref 0.35–5.50)

## 2024-01-29 NOTE — Assessment & Plan Note (Signed)
 Check A1c.  Discussed lifestyle modifications.

## 2024-01-29 NOTE — Progress Notes (Signed)
 Chief Complaint:  Bryan Alvarado is a 53 y.o. male who presents today for his annual comprehensive physical exam.    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  He is doing very well with this.  GERD (gastroesophageal reflux disease) Stable on omeprazole 20 mg daily.  Major depression in remission (HCC) Mildly elevated Patient Health Questionnaire (PHQ) depression screen survey scores today however he attributes this to his recent illness that is being treated with antibiotics by his CF doctors.  Overall symptoms are manageable.  He will let us  know if he needs further assistance with this.  Allergic rhinitis Stable on Flonase   Dyslipidemia Check lipids.  Discussed lifestyle modifications.  Hyperglycemia Check A1c.  Discussed lifestyle modifications.  Generalized anxiety disorder Stable without meds.  Preventative Healthcare: Check labs.  Discussed pneumonia vaccine-he is not sure if he has received this already though we will check with the CF doctors.  Up-to-date on colonoscopy and other vaccines.  Patient Counseling(The following topics were reviewed and/or handout was given):  -Nutrition: Stressed importance of moderation in sodium/caffeine intake, saturated fat and cholesterol, caloric balance, sufficient intake of fresh fruits, vegetables, and fiber.  -Stressed the importance of regular exercise.   -Substance Abuse: Discussed cessation/primary prevention of tobacco, alcohol, or other drug use; driving or other dangerous activities under the influence; availability of treatment for abuse.   -Injury prevention: Discussed safety belts, safety helmets, smoke detector, smoking near bedding or upholstery.   -Sexuality: Discussed sexually transmitted diseases, partner selection, use of condoms, avoidance of unintended pregnancy and contraceptive alternatives.   -Dental health: Discussed importance of regular  tooth brushing, flossing, and dental visits.  -Health maintenance and immunizations reviewed. Please refer to Health maintenance section.  Return to care in 1 year for next preventative visit.  Check blood    Subjective:  HPI:  He has no acute complaints today. See Assessment / plan for status of chronic conditions.   Lifestyle Diet: Balanced. Trying to cut down on sugar and carbohydrates.  Exercise: Planning on going to gym soon.      01/28/2023    8:07 AM  Depression screen PHQ 2/9  Decreased Interest 0  Down, Depressed, Hopeless 0  PHQ - 2 Score 0    Health Maintenance Due  Topic Date Due   HIV Screening  Never done   Hepatitis C Screening  Never done   Hepatitis B Vaccines (1 of 3 - 19+ 3-dose series) Never done     ROS: Per HPI, otherwise a complete review of systems was negative.   PMH:  The following were reviewed and entered/updated in epic: Past Medical History:  Diagnosis Date   Cystic fibrosis    Depression    Pancreatic cystic fibrosis (HCC)    Sinusitis    Patient Active Problem List   Diagnosis Date Noted   Dyslipidemia 01/28/2023   Hyperglycemia 01/28/2023   Chronic relapsing pancreatitis (HCC) 12/04/2021   Family history of colon cancer 11/18/2021   Positive ANA (antinuclear antibody) 11/18/2021   Lumbar back pain 09/05/2021   Osteoarthritis 09/05/2021   GERD (gastroesophageal reflux disease) 10/18/2020   Major depression in remission (HCC) 10/18/2020   Onychomycosis 10/18/2020   Generalized anxiety disorder 06/09/2019   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   Pain in both hands 10/30/2016   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   Allergic rhinitis due to pollen 02/22/2015   Candidal stomatitis 12/05/2013   Cystic fibrosis (HCC)  06/12/2012   Chronic sinusitis 06/12/2012   Allergic rhinitis 06/12/2012   History of chronic pancreatitis 08/04/2008   Past Surgical History:  Procedure Laterality Date   CHOLECYSTECTOMY      FRONTAL SINUSOTOMY     pancreatic stent      Family History  Problem Relation Age of Onset   Cancer Mother    Hyperlipidemia Father    Hypertension Father    Cancer Father    Arthritis Father    Arthritis Sister    Hyperlipidemia Brother     Medications- reviewed and updated Current Outpatient Medications  Medication Sig Dispense Refill   acetaminophen  (TYLENOL ) 500 MG tablet Take by mouth.     albuterol  (VENTOLIN  HFA) 108 (90 Base) MCG/ACT inhaler Inhale 2 puffs into the lungs 2 (two) times daily as needed for wheezing or shortness of breath.     amoxicillin -clavulanate (AUGMENTIN ) 875-125 MG tablet Take 1 tablet by mouth 2 (two) times daily for 14 days. 28 tablet 0   calcium  carbonate (OS-CAL) 1250 (500 Ca) MG chewable tablet Chew by mouth.     cetirizine (ZYRTEC) 10 MG tablet Take 10 mg by mouth daily.      cholecalciferol (VITAMIN D) 1000 UNITS tablet Take 1,000 Units by mouth daily.     Diclofenac  Sodium (PENNSAID ) 2 % SOLN Apply 2-4 times daily as needed. 112 g 0   Elexacaf-Tezacaf-Ivacaf&Ivacaf (TRIKAFTA) 100-50-75 & 150 MG TBPK Take 2 Tablets (Elexacaftor 100mg /Tezacaftor 50mg /Ivacaftor 75mg ) by mouth in the AM and 1 tablet (ivacaftor 150mg ) in the PM w/fatty food     fluticasone  (FLONASE ) 50 MCG/ACT nasal spray Place 2 sprays into the nose daily.     Fluticasone -Salmeterol (ADVAIR) 500-50 MCG/DOSE AEPB Inhale 1 puff into the lungs every 12 (twelve) hours.     Hypertonic Nasal Wash (SINUS RINSE REFILL) PACK 0.5 Bottles.     ibuprofen (ADVIL) 200 MG tablet Take by mouth.     methocarbamol  (ROBAXIN ) 500 MG tablet Take 1 tablet (500 mg total) by mouth every 8 (eight) hours as needed for muscle spasms. 90 tablet 2   Multiple Vitamin (MULTI-VITAMIN) tablet Take 1 tablet by mouth daily.     omeprazole (PRILOSEC) 20 MG capsule Take 20 mg by mouth every morning.     ondansetron  (ZOFRAN -ODT) 4 MG disintegrating tablet Take by mouth.     Sodium Chloride , Inhalant, 7 % NEBU Inhale  into the lungs.     triamcinolone  (KENALOG ) 0.1 % paste Apply to affected area 4 times daily after meals and at bedtime. 5 g PRN   No current facility-administered medications for this visit.    Allergies-reviewed and updated Allergies  Allergen Reactions   Sulfa Antibiotics Hives and Other (See Comments)   Sulfamethoxazole-Trimethoprim Hives, Other (See Comments) and Rash   Septra [Bactrim] Hives    Social History   Socioeconomic History   Marital status: Married    Spouse name: Not on file   Number of children: Not on file   Years of education: Not on file   Highest education level: Not on file  Occupational History   Not on file  Tobacco Use   Smoking status: Never   Smokeless tobacco: Never  Vaping Use   Vaping status: Never Used  Substance and Sexual Activity   Alcohol use: No   Drug use: Never   Sexual activity: Yes  Other Topics Concern   Not on file  Social History Narrative   Not on file   Social Drivers of Health  Financial Resource Strain: Low Risk  (07/15/2021)   Received from [facility]   Overall Financial Resource Strain (CARDIA)    Difficulty of Paying Living Expenses: Not hard at all  Food Insecurity: No Food Insecurity (07/15/2021)   Received from [facility]   Hunger Vital Sign    Within the past 12 months, you worried that your food would run out before you got the money to buy more.: Never true    Within the past 12 months, the food you bought just didn't last and you didn't have money to get more.: Never true  Transportation Needs: No Transportation Needs (07/15/2021)   Received from [facility] - Transportation    Lack of Transportation (Medical): No    Lack of Transportation (Non-Medical): No  Physical Activity: Not on file  Stress: Not on file  Social Connections: Not on file        Objective:  Physical Exam: BP 107/73   Pulse 88   Temp 97.7 F (36.5 C) (Temporal)   Ht 5' 8.5 (1.74 m)   Wt 191 lb 6.4  oz (86.8 kg)   SpO2 98%   BMI 28.68 kg/m   Body mass index is 28.68 kg/m. Wt Readings from Last 3 Encounters:  01/29/24 191 lb 6.4 oz (86.8 kg)  01/28/23 186 lb 3.2 oz (84.5 kg)  12/04/21 185 lb 6.4 oz (84.1 kg)   Gen: NAD, resting comfortably HEENT: TMs normal bilaterally. OP clear. No thyromegaly noted.  CV: RRR with no murmurs appreciated Pulm: NWOB, CTAB with no crackles, wheezes, or rhonchi GI: Normal bowel sounds present. Soft, Nontender, Nondistended. MSK: no edema, cyanosis, or clubbing noted Skin: warm, dry Neuro: CN2-12 grossly intact. Strength 5/5 in upper and lower extremities. Reflexes symmetric and intact bilaterally.  Psych: Normal affect and thought content     Makaia Rappa M. Kennyth, MD 01/29/2024 8:34 AM

## 2024-01-29 NOTE — Assessment & Plan Note (Signed)
 Check lipids. Discussed lifestyle modifications.

## 2024-01-29 NOTE — Addendum Note (Signed)
 Addended by: IDA ELORA HERO on: 01/29/2024 08:36 AM   Modules accepted: Orders

## 2024-01-29 NOTE — Patient Instructions (Signed)
 It was very nice to see you today!  We will check blood work today  Please check with your CF doctors about the pneumonia vaccine.  We can do this here at your next visit.  Please keep up the great work with diet and exercise.  I will see you back in year.  Come back sooner if needed.  Return in about 1 year (around 01/28/2025) for Annual Physical.   Take care, Dr Kennyth  PLEASE NOTE:  If you had any lab tests, please let us  know if you have not heard back within a few days. You may see your results on mychart before we have a chance to review them but we will give you a call once they are reviewed by us .   If we ordered any referrals today, please let us  know if you have not heard from their office within the next week.   If you had any urgent prescriptions sent in today, please check with the pharmacy within an hour of our visit to make sure the prescription was transmitted appropriately.   Please try these tips to maintain a healthy lifestyle:  Eat at least 3 REAL meals and 1-2 snacks per day.  Aim for no more than 5 hours between eating.  If you eat breakfast, please do so within one hour of getting up.   Each meal should contain half fruits/vegetables, one quarter protein, and one quarter carbs (no bigger than a computer mouse)  Cut down on sweet beverages. This includes juice, soda, and sweet tea.   Drink at least 1 glass of water with each meal and aim for at least 8 glasses per day  Exercise at least 150 minutes every week.     Preventive Care 41-1 Years Old, Male Preventive care refers to lifestyle choices and visits with your health care provider that can promote health and wellness. Preventive care visits are also called wellness exams. What can I expect for my preventive care visit? Counseling During your preventive care visit, your health care provider may ask about your: Medical history, including: Past medical problems. Family medical history. Current  health, including: Emotional well-being. Home life and relationship well-being. Sexual activity. Lifestyle, including: Alcohol, nicotine or tobacco, and drug use. Access to firearms. Diet, exercise, and sleep habits. Safety issues such as seatbelt and bike helmet use. Sunscreen use. Work and work Astronomer. Physical exam Your health care provider will check your: Height and weight. These may be used to calculate your BMI (body mass index). BMI is a measurement that tells if you are at a healthy weight. Waist circumference. This measures the distance around your waistline. This measurement also tells if you are at a healthy weight and may help predict your risk of certain diseases, such as type 2 diabetes and high blood pressure. Heart rate and blood pressure. Body temperature. Skin for abnormal spots. What immunizations do I need?  Vaccines are usually given at various ages, according to a schedule. Your health care provider will recommend vaccines for you based on your age, medical history, and lifestyle or other factors, such as travel or where you work. What tests do I need? Screening Your health care provider may recommend screening tests for certain conditions. This may include: Lipid and cholesterol levels. Diabetes screening. This is done by checking your blood sugar (glucose) after you have not eaten for a while (fasting). Hepatitis B test. Hepatitis C test. HIV (human immunodeficiency virus) test. STI (sexually transmitted infection) testing, if you are  at risk. Lung cancer screening. Prostate cancer screening. Colorectal cancer screening. Talk with your health care provider about your test results, treatment options, and if necessary, the need for more tests. Follow these instructions at home: Eating and drinking  Eat a diet that includes fresh fruits and vegetables, whole grains, lean protein, and low-fat dairy products. Take vitamin and mineral supplements as  recommended by your health care provider. Do not drink alcohol if your health care provider tells you not to drink. If you drink alcohol: Limit how much you have to 0-2 drinks a day. Know how much alcohol is in your drink. In the [place] one drink equals one 12 oz bottle of beer (355 mL), one 5 oz glass of wine (148 mL), or one 1 oz glass of hard liquor (44 mL). Lifestyle Brush your teeth every morning and night with fluoride toothpaste. Floss one time each day. Exercise for at least 30 minutes 5 or more days each week. Do not use any products that contain nicotine or tobacco. These products include cigarettes, chewing tobacco, and vaping devices, such as e-cigarettes. If you need help quitting, ask your health care provider. Do not use drugs. If you are sexually active, practice safe sex. Use a condom or other form of protection to prevent STIs. Take aspirin only as told by your health care provider. Make sure that you understand how much to take and what form to take. Work with your health care provider to find out whether it is safe and beneficial for you to take aspirin daily. Find healthy ways to manage stress, such as: Meditation, yoga, or listening to music. Journaling. Talking to a trusted person. Spending time with friends and family. Minimize exposure to UV radiation to reduce your risk of skin cancer. Safety Always wear your seat belt while driving or riding in a vehicle. Do not drive: If you have been drinking alcohol. Do not ride with someone who has been drinking. When you are tired or distracted. While texting. If you have been using any mind-altering substances or drugs. Wear a helmet and other protective equipment during sports activities. If you have firearms in your house, make sure you follow all gun safety procedures. What's next? Go to your health care provider once a year for an annual wellness visit. Ask your health care provider how often you should have your eyes  and teeth checked. Stay up to date on all vaccines. This information is not intended to replace advice given to you by your health care provider. Make sure you discuss any questions you have with your health care provider. Document Revised: 01/16/2021 Document Reviewed: 01/16/2021 Elsevier Patient Education  2024 [employer].

## 2024-01-29 NOTE — Assessment & Plan Note (Signed)
 Stable on Flonase.

## 2024-01-29 NOTE — Assessment & Plan Note (Signed)
 Mildly elevated Patient Health Questionnaire (PHQ) depression screen survey scores today however he attributes this to his recent illness that is being treated with antibiotics by his CF doctors.  Overall symptoms are manageable.  He will let us  know if he needs further assistance with this.

## 2024-01-29 NOTE — Assessment & Plan Note (Signed)
Stable on omeprazole 20mg daily.  

## 2024-01-29 NOTE — Assessment & Plan Note (Signed)
Stable without meds. 

## 2024-01-29 NOTE — Assessment & Plan Note (Signed)
 Stable on Trikafta per pulmonology at [facility].  He is doing very well with this.

## 2024-01-30 LAB — MEASLES/MUMPS/RUBELLA IMMUNITY
Mumps IgG: >300 [AU]/ml
Rubella: 1.79 {index}
Rubeola IgG: >300 [AU]/ml

## 2024-01-30 LAB — HEPATITIS C ANTIBODY: Hepatitis C Ab: NONREACTIVE

## 2024-02-01 ENCOUNTER — Telehealth: Payer: Self-pay | Admitting: *Deleted

## 2024-02-01 ENCOUNTER — Other Ambulatory Visit (INDEPENDENT_AMBULATORY_CARE_PROVIDER_SITE_OTHER)

## 2024-02-01 ENCOUNTER — Other Ambulatory Visit: Payer: Self-pay | Admitting: *Deleted

## 2024-02-01 ENCOUNTER — Ambulatory Visit: Payer: Self-pay | Admitting: Family Medicine

## 2024-02-01 DIAGNOSIS — D72818 Other decreased white blood cell count: Secondary | ICD-10-CM

## 2024-02-01 NOTE — Progress Notes (Signed)
 His blood counts dropped a little bit.  This is probably nothing to worry about but I would like for him to come back to recheck.  Please place future order for CBC and iron panel.  Cholesterol is borderline elevated.  Do not need to start meds but he should continue to work on diet and exercise and we can recheck again in a year or so.  His measles antibodies are at goal-he does not need any boosters at this point.  His A1c is borderline elevated.  As above do not need to start meds but he should continue to work on diet and exercise and we can recheck again in a year or so.  All of his other labs are at goal.

## 2024-02-01 NOTE — Telephone Encounter (Signed)
 See results note.

## 2024-02-02 ENCOUNTER — Ambulatory Visit: Payer: Self-pay | Admitting: Family Medicine

## 2024-02-02 LAB — CBC WITH DIFFERENTIAL/PLATELET
Basophils Absolute: 0.1 10*3/uL (ref 0.0–0.1)
Basophils Relative: 1.2 % (ref 0.0–3.0)
Eosinophils Absolute: 0.4 10*3/uL (ref 0.0–0.7)
Eosinophils Relative: 5 % (ref 0.0–5.0)
HCT: 36.8 % — ABNORMAL LOW (ref 39.0–52.0)
Hemoglobin: 12.1 g/dL — ABNORMAL LOW (ref 13.0–17.0)
Lymphocytes Relative: 13.4 % (ref 12.0–46.0)
Lymphs Abs: 1 10*3/uL (ref 0.7–4.0)
MCHC: 33 g/dL (ref 30.0–36.0)
MCV: 75.4 fl — ABNORMAL LOW (ref 78.0–100.0)
Monocytes Absolute: 0.7 10*3/uL (ref 0.1–1.0)
Monocytes Relative: 9.2 % (ref 3.0–12.0)
Neutro Abs: 5.3 10*3/uL (ref 1.4–7.7)
Neutrophils Relative %: 71.2 % (ref 43.0–77.0)
Platelets: 417 10*3/uL — ABNORMAL HIGH (ref 150.0–400.0)
RBC: 4.87 Mil/uL (ref 4.22–5.81)
RDW: 15.9 % — ABNORMAL HIGH (ref 11.5–15.5)
WBC: 7.4 10*3/uL (ref 4.0–10.5)

## 2024-02-02 LAB — IRON,TIBC AND FERRITIN PANEL
%SAT: 7 % — ABNORMAL LOW (ref 20–48)
Ferritin: 7 ng/mL — ABNORMAL LOW (ref 38–380)
Iron: 30 ug/dL — ABNORMAL LOW (ref 50–180)
TIBC: 407 ug/dL (ref 250–425)

## 2024-02-02 NOTE — Progress Notes (Signed)
 His iron counts are low.  Recommend he start iron supplementation ferrous sulfate 65 mg every other day on empty stomach with vitamin C.  We should recheck in 3 to 6 months.  Recommend we check an FOBT to look for any signs of internal bleeding.  Please place future order.

## 2024-02-03 ENCOUNTER — Other Ambulatory Visit: Payer: Self-pay

## 2024-02-03 DIAGNOSIS — E611 Iron deficiency: Secondary | ICD-10-CM

## 2024-05-18 ENCOUNTER — Telehealth: Payer: Self-pay | Admitting: *Deleted

## 2024-05-18 ENCOUNTER — Other Ambulatory Visit: Payer: Self-pay | Admitting: *Deleted

## 2024-05-18 DIAGNOSIS — E611 Iron deficiency: Secondary | ICD-10-CM

## 2024-05-18 NOTE — Telephone Encounter (Signed)
 Copied from CRM [id number]. Topic: Clinical - Request for Lab/Test Order >> May 17, 2024  2:51 PM Carlyon D wrote: Reason for CRM: pt is asking if an order can be placed in his chart for lab work to check his iron level as he was told to follow up on it in 3 months but there are no orders entered. Please enter orders so pt can schedule   Please schedule a lab appt  Order placed  [facility]

## 2024-05-20 ENCOUNTER — Other Ambulatory Visit (INDEPENDENT_AMBULATORY_CARE_PROVIDER_SITE_OTHER)

## 2024-05-20 DIAGNOSIS — E611 Iron deficiency: Secondary | ICD-10-CM | POA: Diagnosis not present

## 2024-05-21 LAB — IRON,TIBC AND FERRITIN PANEL
%SAT: 26 % (ref 20–48)
Ferritin: 14 ng/mL — ABNORMAL LOW (ref 38–380)
Iron: 108 ug/dL (ref 50–180)
TIBC: 408 ug/dL (ref 250–425)

## 2024-05-23 ENCOUNTER — Ambulatory Visit: Payer: Self-pay | Admitting: Family Medicine

## 2024-05-23 ENCOUNTER — Other Ambulatory Visit

## 2024-05-23 DIAGNOSIS — E611 Iron deficiency: Secondary | ICD-10-CM

## 2024-05-23 DIAGNOSIS — R7989 Other specified abnormal findings of blood chemistry: Secondary | ICD-10-CM

## 2024-05-23 NOTE — Progress Notes (Signed)
 His iron counts have improved slightly.  He should continue with the iron supplementation we can recheck again in 3 to 6 months  Can we also check on the status of his FOBT?  This was ordered 3 months ago however has not been completed.  It is very important that we rule out sources of internal bleeding and recommend we complete this ASAP.

## 2024-05-25 LAB — FECAL OCCULT BLOOD, IMMUNOCHEMICAL: Fecal Occult Bld: NEGATIVE

## 2024-05-25 NOTE — Progress Notes (Signed)
 His FOBT is negative which means there is no blood in his stool though I am still concerned about the slow response he has had with his iron levels. Can we add on a CBC with differential to his previous labs? We may have to have him come back for this.

## 2024-05-26 ENCOUNTER — Other Ambulatory Visit

## 2024-05-26 DIAGNOSIS — E611 Iron deficiency: Secondary | ICD-10-CM | POA: Diagnosis not present

## 2024-05-26 DIAGNOSIS — R7989 Other specified abnormal findings of blood chemistry: Secondary | ICD-10-CM

## 2024-05-26 LAB — CBC WITH DIFFERENTIAL/PLATELET
Basophils Absolute: 0 K/uL (ref 0.0–0.1)
Basophils Relative: 0.5 % (ref 0.0–3.0)
Eosinophils Absolute: 0.3 K/uL (ref 0.0–0.7)
Eosinophils Relative: 5.5 % — ABNORMAL HIGH (ref 0.0–5.0)
HCT: 43.1 % (ref 39.0–52.0)
Hemoglobin: 14.4 g/dL (ref 13.0–17.0)
Lymphocytes Relative: 16.6 % (ref 12.0–46.0)
Lymphs Abs: 0.9 K/uL (ref 0.7–4.0)
MCHC: 33.5 g/dL (ref 30.0–36.0)
MCV: 84 fl (ref 78.0–100.0)
Monocytes Absolute: 0.6 K/uL (ref 0.1–1.0)
Monocytes Relative: 10.9 % (ref 3.0–12.0)
Neutro Abs: 3.6 K/uL (ref 1.4–7.7)
Neutrophils Relative %: 66.5 % (ref 43.0–77.0)
Platelets: 306 K/uL (ref 150.0–400.0)
RBC: 5.12 Mil/uL (ref 4.22–5.81)
RDW: 15.6 % — ABNORMAL HIGH (ref 11.5–15.5)
WBC: 5.4 K/uL (ref 4.0–10.5)

## 2024-05-26 LAB — TESTOSTERONE: Testosterone: 347.08 ng/dL (ref 300.00–890.00)

## 2024-05-26 NOTE — Progress Notes (Signed)
Ok to order testosterone level.

## 2024-05-27 ENCOUNTER — Encounter: Payer: Self-pay | Admitting: Family Medicine

## 2024-05-27 ENCOUNTER — Ambulatory Visit: Payer: Self-pay | Admitting: Family Medicine

## 2024-05-27 DIAGNOSIS — R7989 Other specified abnormal findings of blood chemistry: Secondary | ICD-10-CM

## 2024-05-27 NOTE — Progress Notes (Signed)
 Hemoglobin is better.  We can recheck this again in a few months.  Testosterone is on the lower range of normal. Please see the MyChart message we sent this morning that we can recheck again in a few weeks if he wishes.

## 2024-05-27 NOTE — Telephone Encounter (Signed)
**Note De-identified  [place] Obfuscation** Please advise 

## 2024-05-27 NOTE — Telephone Encounter (Signed)
 Appreciate the update. There were several reasons for low iron and low ferritin however the primary concern is making sure there are no signs of internal bleeding which is why we do follow-up testing and monitor closely.  It is definitely possible that it may be due to chronic diarrhea or low absorption issues.  It also can be due to medication side effects.  Regarding testosterone, insurance typically requires 2 separate low values before they will approve replacement.  We can recheck again in a few weeks and if low again we can discuss replacement at that point.

## 2024-06-10 ENCOUNTER — Other Ambulatory Visit (INDEPENDENT_AMBULATORY_CARE_PROVIDER_SITE_OTHER)

## 2024-06-10 DIAGNOSIS — R7989 Other specified abnormal findings of blood chemistry: Secondary | ICD-10-CM

## 2024-06-10 LAB — TESTOSTERONE: Testosterone: 307.79 ng/dL (ref 300.00–890.00)

## 2024-06-13 ENCOUNTER — Ambulatory Visit: Payer: Self-pay | Admitting: Family Medicine

## 2024-06-13 NOTE — Progress Notes (Signed)
 Testosterone is in the lower range of normal.  Due to the his levels being in the normal range he is not a candidate for testosterone replacement therapy at this time.  We can recheck again at next visit here.

## 2024-06-13 NOTE — Telephone Encounter (Signed)
**Note De-identified  [place] Obfuscation** Please advise 

## 2024-06-14 ENCOUNTER — Encounter: Payer: Self-pay | Admitting: Family Medicine

## 2024-06-14 DIAGNOSIS — M542 Cervicalgia: Secondary | ICD-10-CM

## 2024-06-15 NOTE — Telephone Encounter (Signed)
 It is definitely possible that he is having symptoms of low testosterone and that his levels may be fluctuating. We can try checking again in the future if he wishes but insurance will not pay for testosterone replacement until he has 2 low values.

## 2024-06-15 NOTE — Telephone Encounter (Signed)
 Please advise if referral can be completed per patient's request.  Please and thank you.

## 2024-06-16 NOTE — Telephone Encounter (Signed)
 Ok to place referral  Worth HERO. Kennyth, MD 06/16/2024 7:31 AM

## 2024-06-16 NOTE — Telephone Encounter (Signed)
 Patient notified about approval or referral via MyChart. Referral request has been sent to [facility].    Olam: can we please send a referral to Dr. Ozell Ada at [facility] for his back and neck per request of patient and approval from Dr. Kennyth. Thank you!

## 2024-06-17 ENCOUNTER — Encounter: Payer: Self-pay | Admitting: Family Medicine

## 2024-06-29 ENCOUNTER — Ambulatory Visit: Payer: Self-pay

## 2024-06-29 NOTE — Telephone Encounter (Signed)
 Has an appt on Monday smk

## 2024-06-29 NOTE — Telephone Encounter (Signed)
 FYI Only or Action Required?: FYI only for provider: appointment scheduled on 07/04/2024.  Patient was last seen in primary care on 01/29/2024 by Kennyth Worth HERO, MD.  Called Nurse Triage reporting Anxiety.  Symptoms began worsening 6 months.  Interventions attempted: Rest, hydration, or home remedies.  Symptoms are: gradually worsening.  Triage Disposition: See PCP Within 2 Weeks  Patient/caregiver understands and will follow disposition?: Yes    Copied from CRM #[id number]. Topic: Clinical - Red Word Triage >> Jun 29, 2024  9:54 AM Eva FALCON wrote: Red Word that prompted transfer to Nurse Triage: Pt states he is feeling miserable, around 4pm feels more anxious and depressed, low libido. He doesn't feel right doesn't feel normal. Reason for Disposition  [1] Symptoms of anxiety or panic attack AND [2] is a chronic symptom (recurrent or ongoing AND present > 4 weeks)  Answer Assessment - Initial Assessment Questions 1. CONCERN: Did anything happen that prompted you to call today?      Increased days of low libido 2. ANXIETY SYMPTOMS: Can you describe how you (your loved one; patient) have been feeling? (e.g., tense, restless, panicky, anxious, keyed up, overwhelmed, sense of impending doom).      Anxious, irritability,  3. ONSET: How long have you been feeling this way? (e.g., hours, days, weeks)     2 to 3 years and worsening 6 months 4. SEVERITY: How would you rate the level of anxiety? (e.g., 0 - 10; or mild, moderate, severe).     Moderate  5. FUNCTIONAL IMPAIRMENT: How have these feelings affected your ability to do daily activities? Have you had more difficulty than usual doing your normal daily activities? (e.g., getting better, same, worse; self-care, school, work, interactions)     Able to function daily but concentration is not there 6. HISTORY: Have you felt this way before? Have you ever been diagnosed with an anxiety problem in the past? (e.g., generalized  anxiety disorder, panic attacks, PTSD). If Yes, ask: How was this problem treated? (e.g., medicines, counseling, etc.)     Depression, anxiety 7. RISK OF HARM - SUICIDAL IDEATION: Do you ever have thoughts of hurting or killing yourself? If Yes, ask:  Do you have these feelings now? Do you have a plan on how you would do this?     no 8. TREATMENT:  What has been done so far to treat this anxiety? (e.g., medicines, relaxation strategies). What has helped?     Nap, medications,  9. THERAPIST: Do you have a counselor or therapist? If Yes, ask: What is their name?     yes 10. POTENTIAL TRIGGERS: Do you drink caffeinated beverages (e.g., coffee, colas, teas), and how much daily? Do you drink alcohol or use any drugs? Have you started any new medicines recently?       no 11. PATIENT SUPPORT: Who is with you now? Who do you live with? Do you have family or friends who you can talk to?        yes 12. OTHER SYMPTOMS: Do you have any other symptoms? (e.g., feeling depressed, trouble concentrating, trouble sleeping, trouble breathing, palpitations or fast heartbeat, chest pain, sweating, nausea, or diarrhea)       Trouble concentrating 13. PREGNANCY: Is there any chance you are pregnant? When was your last menstrual period?       na  Days that pt works out - he feels better. Days that pt takes a nap - he feels better.  Worsening after 3 pm unless has  nap.  Pt stated has therapist and has talk w/PCP about this and thinks it could possibly be r/t low testosterone : pt wants to find out for sure b/c thinks it is continuing to worsening.  Protocols used: Anxiety and Panic Attack-A-AH

## 2024-07-04 ENCOUNTER — Ambulatory Visit: Admitting: Family Medicine

## 2024-07-04 ENCOUNTER — Other Ambulatory Visit (HOSPITAL_BASED_OUTPATIENT_CLINIC_OR_DEPARTMENT_OTHER): Payer: Self-pay

## 2024-07-04 ENCOUNTER — Encounter: Payer: Self-pay | Admitting: Family Medicine

## 2024-07-04 VITALS — BP 136/86 | HR 77 | Temp 97.2°F | Ht 68.5 in | Wt 188.8 lb

## 2024-07-04 DIAGNOSIS — E291 Testicular hypofunction: Secondary | ICD-10-CM | POA: Diagnosis not present

## 2024-07-04 DIAGNOSIS — M545 Low back pain, unspecified: Secondary | ICD-10-CM

## 2024-07-04 MED ORDER — TESTOSTERONE 20.25 MG/ACT (1.62%) TD GEL
1.0000 | Freq: Every day | TRANSDERMAL | 5 refills | Status: DC
  Filled 2024-07-04: qty 75, 60d supply, fill #0
  Filled 2024-08-09: qty 75, 30d supply, fill #1

## 2024-07-04 NOTE — Assessment & Plan Note (Signed)
 Following with orthopedics.  He is working on getting in with Ortho care.  He will let us  know if he needs referral.

## 2024-07-04 NOTE — Patient Instructions (Signed)
 It was very nice to see you today!  VISIT SUMMARY: You visited us  today to discuss your symptoms of low energy, irritability, and low sex drive, which are related to low testosterone  levels. We have decided to start you on a topical testosterone  gel to help improve your symptoms.  YOUR PLAN: TESTICULAR HYPOFUNCTION (HYPOGONADISM): You have low testosterone  levels, which are causing symptoms like low libido, irritability, fatigue, and mood changes. Your symptoms seem to improve with weight lifting. -Start using the prescribed testosterone  gel daily. Apply it once a day, ensuring it dries on your skin within 10-15 minutes. Wash your hands after application to prevent transfer to others. -We will check your testosterone  levels, PSA, and blood counts in two weeks. Please schedule a follow-up appointment in two weeks to review your lab results and assess your symptom improvement.  Return if symptoms worsen or fail to improve.   Take care, Dr Kennyth  PLEASE NOTE:  If you had any lab tests, please let us  know if you have not heard back within a few days. You may see your results on mychart before we have a chance to review them but we will give you a call once they are reviewed by us .   If we ordered any referrals today, please let us  know if you have not heard from their office within the next week.   If you had any urgent prescriptions sent in today, please check with the pharmacy within an hour of our visit to make sure the prescription was transmitted appropriately.   Please try these tips to maintain a healthy lifestyle:  Eat at least 3 REAL meals and 1-2 snacks per day.  Aim for no more than 5 hours between eating.  If you eat breakfast, please do so within one hour of getting up.   Each meal should contain half fruits/vegetables, one quarter protein, and one quarter carbs (no bigger than a computer mouse)  Cut down on sweet beverages. This includes juice, soda, and sweet tea.   Drink  at least 1 glass of water with each meal and aim for at least 8 glasses per day  Exercise at least 150 minutes every week.

## 2024-07-04 NOTE — Progress Notes (Signed)
   Cowen Pesqueira is a 53 y.o. male who presents today for an office visit.  Assessment/Plan:  Chronic Problems Addressed Today: Hypogonadism in male Had a lengthy discussion with patient today regarding his symptoms of low testosterone  including fatigue, decreased libido, and irritability.  His most recent testosterone  levels were right at the cutoff on the bottom range of normal, though it would be reasonable for him to start testosterone  replacement given his symptoms.  We did discuss potential risks and benefits of starting testosterone  replacement therapy.  We also discussed potential modalities for testosterone  replacement as well.  We will start AndroGel  1 pump daily and he will come back in 2 weeks after starting this to recheck labs including testosterone , PSA, and CBC.  He will follow-up with us  in a few weeks via MyChart.  If his symptoms do not improve with adequate replacement for testosterone  we will need to explore other potential etiologies.  Lumbar back pain Following with orthopedics.  He is working on getting in with Ortho care.  He will let us  know if he needs referral.     Subjective:  HPI:  See assessment / plan for status of chronic conditions.   Discussed the use of AI scribe software for clinical note transcription with the patient, who gave verbal consent to proceed.  History of Present Illness Irma Delancey is a 53 year old male with low testosterone  who presents with symptoms of low energy, irritability, and low sex drive.  He has been experiencing low energy, irritability, and decreased sex drive. His testosterone  levels were previously found to be low, although still within the threshold set by insurance for treatment coverage. He is not currently on any prescribed medications for these symptoms.  On days when he lifts weights, he does not experience the afternoon fatigue that he typically feels. He believes that weightlifting might be helping to  increase his testosterone  levels. However, he experiences clusters of three to four days where he feels 'miserable' in the afternoons, describing feelings of depression, anxiety, and irritability, although he clarifies that it is more of an irritable and melancholic feeling.  He has been trying to manage his symptoms by going to the gym since October. In his social history, he swims three times a week, which he finds beneficial for his overall health, particularly for his knees and back. 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         Objective:  Physical Exam: BP 136/86   Pulse 77   Temp (!) 97.2 F (36.2 C) (Temporal)   Ht 5' 8.5 (1.74 m)   Wt 188 lb 12.8 oz (85.6 kg)   SpO2 98%   BMI 28.29 kg/m   Gen: No acute distress, resting comfortably Neuro: Grossly normal, moves all extremities Psych: Normal affect and thought content      Kaiah Hosea M. Kennyth, MD 07/04/2024 9:00 AM

## 2024-07-04 NOTE — Assessment & Plan Note (Signed)
 Had a lengthy discussion with patient today regarding his symptoms of low testosterone  including fatigue, decreased libido, and irritability.  His most recent testosterone  levels were right at the cutoff on the bottom range of normal, though it would be reasonable for him to start testosterone  replacement given his symptoms.  We did discuss potential risks and benefits of starting testosterone  replacement therapy.  We also discussed potential modalities for testosterone  replacement as well.  We will start AndroGel  1 pump daily and he will come back in 2 weeks after starting this to recheck labs including testosterone , PSA, and CBC.  He will follow-up with us  in a few weeks via MyChart.  If his symptoms do not improve with adequate replacement for testosterone  we will need to explore other potential etiologies.

## 2024-07-12 NOTE — Addendum Note (Signed)
 Addended by: FRANCIS ROULEAU A on: 07/12/2024 04:12 PM   Modules accepted: Orders

## 2024-07-15 ENCOUNTER — Other Ambulatory Visit (HOSPITAL_BASED_OUTPATIENT_CLINIC_OR_DEPARTMENT_OTHER): Payer: Self-pay

## 2024-07-20 ENCOUNTER — Encounter: Payer: Self-pay | Admitting: Family Medicine

## 2024-07-20 ENCOUNTER — Other Ambulatory Visit

## 2024-07-20 DIAGNOSIS — E291 Testicular hypofunction: Secondary | ICD-10-CM

## 2024-07-20 LAB — PSA: PSA: 1.63 ng/mL (ref 0.10–4.00)

## 2024-07-20 LAB — CBC
HCT: 41.9 % (ref 39.0–52.0)
Hemoglobin: 14.4 g/dL (ref 13.0–17.0)
MCHC: 34.4 g/dL (ref 30.0–36.0)
MCV: 85.5 fl (ref 78.0–100.0)
Platelets: 304 K/uL (ref 150.0–400.0)
RBC: 4.9 Mil/uL (ref 4.22–5.81)
RDW: 14.4 % (ref 11.5–15.5)
WBC: 5.2 K/uL (ref 4.0–10.5)

## 2024-07-20 LAB — TESTOSTERONE: Testosterone: 336.86 ng/dL (ref 300.00–890.00)

## 2024-07-21 DIAGNOSIS — R7989 Other specified abnormal findings of blood chemistry: Secondary | ICD-10-CM

## 2024-07-21 NOTE — Progress Notes (Signed)
 His testosterone  is up though not quite where we would like for it to be.  Recommend he increase to 2 pumps daily.  We should recheck his levels again in 2 to 4 weeks.  Please place future order for testosterone  and CBC.

## 2024-07-21 NOTE — Telephone Encounter (Signed)
Please advse 

## 2024-07-21 NOTE — Telephone Encounter (Signed)
 Please see result note

## 2024-08-09 ENCOUNTER — Other Ambulatory Visit (HOSPITAL_BASED_OUTPATIENT_CLINIC_OR_DEPARTMENT_OTHER): Payer: Self-pay

## 2024-08-11 ENCOUNTER — Other Ambulatory Visit (INDEPENDENT_AMBULATORY_CARE_PROVIDER_SITE_OTHER)

## 2024-08-11 DIAGNOSIS — R7989 Other specified abnormal findings of blood chemistry: Secondary | ICD-10-CM | POA: Diagnosis not present

## 2024-08-11 LAB — CBC WITH DIFFERENTIAL/PLATELET
Basophils Absolute: 0.1 K/uL (ref 0.0–0.1)
Basophils Relative: 1.2 % (ref 0.0–3.0)
Eosinophils Absolute: 0.4 K/uL (ref 0.0–0.7)
Eosinophils Relative: 6.2 % — ABNORMAL HIGH (ref 0.0–5.0)
HCT: 43.8 % (ref 39.0–52.0)
Hemoglobin: 15 g/dL (ref 13.0–17.0)
Lymphocytes Relative: 16.3 % (ref 12.0–46.0)
Lymphs Abs: 1 K/uL (ref 0.7–4.0)
MCHC: 34.3 g/dL (ref 30.0–36.0)
MCV: 85.7 fl (ref 78.0–100.0)
Monocytes Absolute: 0.5 K/uL (ref 0.1–1.0)
Monocytes Relative: 8.8 % (ref 3.0–12.0)
Neutro Abs: 4.1 K/uL (ref 1.4–7.7)
Neutrophils Relative %: 67.5 % (ref 43.0–77.0)
Platelets: 315 K/uL (ref 150.0–400.0)
RBC: 5.12 Mil/uL (ref 4.22–5.81)
RDW: 13.7 % (ref 11.5–15.5)
WBC: 6.1 K/uL (ref 4.0–10.5)

## 2024-08-11 LAB — TESTOSTERONE: Testosterone: 318.9 ng/dL (ref 300.00–890.00)

## 2024-08-12 ENCOUNTER — Ambulatory Visit: Payer: Self-pay | Admitting: Family Medicine

## 2024-08-12 ENCOUNTER — Other Ambulatory Visit (HOSPITAL_BASED_OUTPATIENT_CLINIC_OR_DEPARTMENT_OTHER): Payer: Self-pay

## 2024-08-12 MED ORDER — TESTOSTERONE 20.25 MG/ACT (1.62%) TD GEL
2.0000 | Freq: Every day | TRANSDERMAL | 5 refills | Status: AC
  Filled 2024-08-12: qty 75, 30d supply, fill #0

## 2024-08-12 NOTE — Addendum Note (Signed)
 Addended by: Brodey Bonn M on: 08/12/2024 02:43 PM   Modules accepted: Orders

## 2024-08-12 NOTE — Progress Notes (Signed)
 His testosterone  is still not at goal. Can we verify that he has been using 2 pumps daily? If so we can try increasing to 3 pumps daily or discuss switching him to injections.

## 2024-08-12 NOTE — Progress Notes (Signed)
 I am not sure if that would make that big of a difference however if he is feeling fine on the current dose we can continue.  He can come back in a few weeks to recheck after applying it in the morning.

## 2024-08-15 ENCOUNTER — Other Ambulatory Visit (HOSPITAL_BASED_OUTPATIENT_CLINIC_OR_DEPARTMENT_OTHER): Payer: Self-pay

## 2024-08-29 ENCOUNTER — Ambulatory Visit: Admitting: Orthopedic Surgery

## 2024-08-31 ENCOUNTER — Ambulatory Visit: Admitting: Orthopedic Surgery

## 2024-08-31 ENCOUNTER — Other Ambulatory Visit (INDEPENDENT_AMBULATORY_CARE_PROVIDER_SITE_OTHER)

## 2024-08-31 VITALS — BP 149/100 | HR 71 | Ht 68.0 in | Wt 189.0 lb

## 2024-08-31 DIAGNOSIS — M542 Cervicalgia: Secondary | ICD-10-CM | POA: Diagnosis not present

## 2024-08-31 DIAGNOSIS — M545 Low back pain, unspecified: Secondary | ICD-10-CM | POA: Diagnosis not present

## 2024-08-31 NOTE — Progress Notes (Signed)
 Orthopedic Spine Surgery Office Note  Assessment: Patient is a 54 y.o. male with chronic low back and neck pain. No radicular symptoms or symptoms of myelopathy   Plan: -Explained that initially conservative treatment is tried as a significant number of patients may experience relief with these treatment modalities. Discussed that the conservative treatments include:  -activity modification  -physical therapy  -over the counter pain medications  -medrol dosepak  -cervical steroid injections -Patient has tried tylenol , ibuprofen, PT, home exercise program, muscle relaxers -Since patient has had symptoms for several years without relief with conservative treatments, recommended MRI of the lumbar spine to evaluate further -Discussed possible facet injections as a next non-operative treatment -Patient should return to office in 3-4 weeks, x-rays at next visit: none   Patient expressed understanding of the plan and all questions were answered to the patient's satisfaction.   ___________________________________________________________________________   History:  Patient is a 54 y.o. male who presents today for cervical and lumbar spine. Patient has had about 7 years of low back pain. He had previously been seeing a different provider. He had tried multiple medications, PT, and a home exercise program but his back pain has gotten progressively worse. He will sometimes have pain over his left lateral thigh but no consistent pain in his legs and it is not bothersome or unmanageable like his back pain is. There was no trauma or injury that preceded the onset of his pain.   He also has had neck pain for awhile now too. He has pain in the right periscapular region. There was no trauma or injury that preceded the onset of his pain. His pain is more manageable and mild than his back pain.    Weakness: denies Difficulty with fine motor skills (e.g., buttoning shirts, handwriting): denies Symptoms of  imbalance: denies Paresthesias and numbness: yes, sometimes over the anterior thighs   Bowel or bladder incontinence: denies Saddle anesthesia: denies  Treatments tried: tylenol , ibuprofen, PT, home exercise program, muscle relaxers  Review of systems: Denies fevers and chills, night sweats, unexplained weight loss, history of cancer. Has had pain that wakes him at night  Past medical history: Depression/anxiety Sinusitis Pancreatic CF GERD  Allergies: sulfa  Past surgical history:  Frontal sinus surgery Cholecystectomy   Social history: Denies use of nicotine product (smoking, vaping, patches, smokeless) Alcohol use: denies Denies recreational drug use   Physical Exam:  BMI of 28.7  General: no acute distress, appears stated age Neurologic: alert, answering questions appropriately, following commands Respiratory: unlabored breathing on room air, symmetric chest rise Psychiatric: appropriate affect, normal cadence to speech   MSK (spine):  -Strength exam      Left  Right Grip strength                5/5  5/5 Interosseus   5/5   5/5 Wrist extension  5/5  5/5 Wrist flexion   5/5  5/5 Elbow flexion   5/5  5/5 Deltoid    5/5  5/5  EHL    5/5  5/5 TA    5/5  5/5 GSC    5/5  5/5 Knee extension  5/5  5/5 Hip flexion   5/5  5/5  -Sensory exam    Sensation intact to light touch in L3-S1 nerve distributions of bilateral lower extremities  Sensation intact to light touch in C5-T1 nerve distributions of bilateral upper extremities  -Brachioradialis DTR: 2/4 on the left, 2/4 on the right -Biceps DTR: 2/4 on the left, 2/4  on the right -Achilles DTR: 2/4 on the left, 2/4 on the right -Patellar tendon DTR: 2/4 on the left, 2/4 on the right  -Spurling: negative bilaterally -Hoffman sign: negative bilaterally -Clonus: no beats bilaterally -Interosseous wasting: none seen -Grip and release test: negative -Gait: normal  Imaging: XRs of the cervical spine from  08/31/2024 were independently reviewed and interpreted, showing disc height loss and osteophyte formation at C5/6 and C6/7. More pronounced disc height loss and osteophyte formation at C5/6. No fracture or dislocation seen. No evidence of instability on flexion/extension views.   XRs of the lumbar spine (on CD) from 09/30/2023 were independently reviewed and interpreted, showing disc height loss at L5/S1. No other significant degenerative changes. No fracture or dislocation seen. No evidence of instability on flexion/extension views.    Patient name: Bryan Alvarado Patient MRN: [id number] Date of visit: 08/31/24

## 2024-09-07 ENCOUNTER — Telehealth: Payer: Self-pay | Admitting: *Deleted

## 2024-09-07 ENCOUNTER — Other Ambulatory Visit: Payer: Self-pay | Admitting: *Deleted

## 2024-09-07 DIAGNOSIS — E291 Testicular hypofunction: Secondary | ICD-10-CM

## 2024-09-07 NOTE — Telephone Encounter (Signed)
 Scheduled 09/09/24

## 2024-09-07 NOTE — Telephone Encounter (Signed)
 Copied from CRM #[id number]. Topic: Clinical - Lab/Test Results >> Sep 07, 2024 10:25 AM Mercedes MATSU wrote: Reason for CRM: Patient called in stating that he needed to scheduled a lab but there was not a order in the chart. Patient was informed that I could not schedule appt due to no labs being active, patient is requesting labs be added to the chart and he be contacted once they are added for scheduling. He can be reached at [phone]   Please schedule lab appt, early in the morning  Future lab order  [facility]

## 2024-09-09 ENCOUNTER — Other Ambulatory Visit

## 2024-09-09 DIAGNOSIS — E291 Testicular hypofunction: Secondary | ICD-10-CM

## 2024-09-09 LAB — TESTOSTERONE: Testosterone: 377.11 ng/dL (ref 300.00–890.00)

## 2024-09-22 ENCOUNTER — Ambulatory Visit: Admitting: Orthopedic Surgery

## 2025-02-02 ENCOUNTER — Encounter: Admitting: Family Medicine
